# Patient Record
Sex: Male | Born: 1995
Health system: Southern US, Community
[De-identification: ages and names within clinical notes are randomized; demographics above are authoritative.]

## PROBLEM LIST (undated history)

## (undated) DIAGNOSIS — Z8619 Personal history of other infectious and parasitic diseases: Secondary | ICD-10-CM

## (undated) HISTORY — PX: OTHER SURGICAL HISTORY: SHX169

## (undated) HISTORY — DX: Personal history of other infectious and parasitic diseases: Z86.19

---
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
## 2018-03-11 ENCOUNTER — Ambulatory Visit (INDEPENDENT_AMBULATORY_CARE_PROVIDER_SITE_OTHER): Payer: BLUE CROSS/BLUE SHIELD | Admitting: Family Medicine

## 2018-03-11 ENCOUNTER — Encounter: Payer: Self-pay | Admitting: Family Medicine

## 2018-03-11 VITALS — BP 118/70 | HR 70 | Temp 98.7°F | Wt 175.0 lb

## 2018-03-11 DIAGNOSIS — J029 Acute pharyngitis, unspecified: Secondary | ICD-10-CM

## 2018-03-11 DIAGNOSIS — R509 Fever, unspecified: Secondary | ICD-10-CM

## 2018-03-11 DIAGNOSIS — J101 Influenza due to other identified influenza virus with other respiratory manifestations: Secondary | ICD-10-CM

## 2018-03-11 LAB — POC INFLUENZA A&B (BINAX/QUICKVUE)
Influenza A, POC: NEGATIVE
Influenza B, POC: POSITIVE — AB

## 2018-03-11 LAB — POCT RAPID STREP A (OFFICE): RAPID STREP A SCREEN: NEGATIVE

## 2018-03-11 MED ORDER — OSELTAMIVIR PHOSPHATE 75 MG PO CAPS: 75.0000 mg | ORAL_CAPSULE | Freq: Two times a day (BID) | ORAL | 0 refills | Status: DC

## 2018-03-11 NOTE — Progress Notes (Signed)
BP 118/70 (BP Location: Left Arm, Patient Position: Sitting, Cuff Size: Normal)   Pulse 70   Temp 98.7 F (37.1 C) (Oral)   Wt 175 lb (79.4 kg)   SpO2 97%   BMI 23.73 kg/m    CC: ST, fever Subjective:    Patient ID: Jared Suarez, male    DOB: 1996/05/11, 22 y.o.   MRN: [id number]  HPI: Chistian Kasler is a 22 y.o. male presenting on 03/11/2018 for Fever (Started 3 days ago. Max 102. Also, loss of appetite. Took Nyquill last night and 3 ibuprofen this AM, helpful.  Pt accompanied by dad. ); Nausea; Sore Throat; and Headache   2d h/o fever (Tmax 102), productive cough, nasal congestion, HA behind eyes, scratchy throat, nausea. Chills. Progressive onset of symptoms.   No body aches, dyspnea or wheezing, PNDrainage.   Nephew recently with strep  So far has taken ibuprofen and nyquil.  No h/o asthma, allergic rhinitis. Out of class due to illness recently.   Relevant past medical, surgical, family and social history reviewed and updated as indicated. Interim medical history since our last visit reviewed. Allergies and medications reviewed and updated. Outpatient Medications Prior to Visit  Medication Sig Dispense Refill  . naproxen (NAPROSYN) 500 MG tablet Take 1 tablet (500 mg total) by mouth 2 (two) times daily with a meal. 30 tablet 0   No facility-administered medications prior to visit.      Per HPI unless specifically indicated in ROS section below Review of Systems     Objective:    BP 118/70 (BP Location: Left Arm, Patient Position: Sitting, Cuff Size: Normal)   Pulse 70   Temp 98.7 F (37.1 C) (Oral)   Wt 175 lb (79.4 kg)   SpO2 97%   BMI 23.73 kg/m   Wt Readings from Last 3 Encounters:  03/11/18 175 lb (79.4 kg) (77 %, Z= 0.74)*  11/11/16 167 lb (75.8 kg) (74 %, Z= 0.66)*  11/04/16 165 lb (74.8 kg) (72 %, Z= 0.59)*   * Growth percentiles are based on CDC (Boys, 2-20 Years) data.    Physical Exam  Constitutional: He appears well-developed and well-nourished.  No distress.  HENT:  Head: Normocephalic and atraumatic.  Right Ear: Hearing, tympanic membrane, external ear and ear canal normal.  Left Ear: Hearing, tympanic membrane, external ear and ear canal normal.  Nose: Mucosal edema (nasal mucosal congestion) and rhinorrhea present. Right sinus exhibits no maxillary sinus tenderness and no frontal sinus tenderness. Left sinus exhibits no maxillary sinus tenderness and no frontal sinus tenderness.  Mouth/Throat: Uvula is midline, oropharynx is clear and moist and mucous membranes are normal. No oropharyngeal exudate, posterior oropharyngeal edema, posterior oropharyngeal erythema or tonsillar abscesses.  Eyes: Pupils are equal, round, and reactive to light. Conjunctivae and EOM are normal. No scleral icterus.  Neck: Normal range of motion. Neck supple.  Cardiovascular: Normal rate, regular rhythm, normal heart sounds and intact distal pulses.  No murmur heard. Pulmonary/Chest: Effort normal and breath sounds normal. No respiratory distress. He has no wheezes. He has no rales.  Lymphadenopathy:    He has no cervical adenopathy.  Skin: Skin is warm and dry. No rash noted.  Nursing note and vitals reviewed.  Results for orders placed or performed in visit on 03/11/18  POCT rapid strep A  Result Value Ref Range   Rapid Strep A Screen Negative Negative  POC Influenza A&B(BINAX/QUICKVUE)  Result Value Ref Range   Influenza A, POC Negative Negative   Influenza  B, POC Positive (A) Negative      Assessment & Plan:   Problem List Items Addressed This Visit    Influenza B - Primary    URI symptoms with high fever over last 2 days. Nontoxic today. RST negative today. Flu swab positive today.  Discussed supportive care.  Discussed pros/cons of tamiflu - pt would like Rx.  Update if not improving with treatment. Contagious nature of illness reviewed.       Relevant Medications   oseltamivir (TAMIFLU) 75 MG capsule    Other Visit Diagnoses    Sore  throat       Relevant Orders   POCT rapid strep A (Completed)       Meds ordered this encounter  Medications  . oseltamivir (TAMIFLU) 75 MG capsule    Sig: Take 1 capsule (75 mg total) by mouth 2 (two) times daily.    Dispense:  10 capsule    Refill:  0   Orders Placed This Encounter  Procedures  . POCT rapid strep A  . POC Influenza A&B(BINAX/QUICKVUE)    Follow up plan: Return if symptoms worsen or fail to improve.  Eustaquio BoydenJavier Payal Stanforth, MD

## 2018-03-11 NOTE — Assessment & Plan Note (Signed)
URI symptoms with high fever over last 2 days. Nontoxic today. RST negative today. Flu swab positive today.  Discussed supportive care.  Discussed pros/cons of tamiflu - pt would like Rx.  Update if not improving with treatment. Contagious nature of illness reviewed.

## 2018-03-11 NOTE — Patient Instructions (Addendum)
You have influenza B Treat with ibuprofen 600mg  with meals for next several days.  Push fluids and rest.  tamiflu sent to pharmacy.  Influenza, Adult Influenza, more commonly known as "the flu," is a viral infection that primarily affects the respiratory tract. The respiratory tract includes organs that help you breathe, such as the lungs, nose, and throat. The flu causes many common cold symptoms, as well as a high fever and body aches. The flu spreads easily from person to person (is contagious). Getting a flu shot (influenza vaccination) every year is the best way to prevent influenza. What are the causes? Influenza is caused by a virus. You can catch the virus by:  Breathing in droplets from an infected person's cough or sneeze.  Touching something that was recently contaminated with the virus and then touching your mouth, nose, or eyes.  What increases the risk? The following factors may make you more likely to get the flu:  Not cleaning your hands frequently with soap and water or alcohol-based hand sanitizer.  Having close contact with many people during cold and flu season.  Touching your mouth, eyes, or nose without washing or sanitizing your hands first.  Not drinking enough fluids or not eating a healthy diet.  Not getting enough sleep or exercise.  Being under a high amount of stress.  Not getting a yearly (annual) flu shot.  You may be at a higher risk of complications from the flu, such as a severe lung infection (pneumonia), if you:  Are over the age of 20.  Are pregnant.  Have a weakened disease-fighting system (immune system). You may have a weakened immune system if you: ? Have HIV or AIDS. ? Are undergoing chemotherapy. ? Aretaking medicines that reduce the activity of (suppress) the immune system.  Have a long-term (chronic) illness, such as heart disease, kidney disease, diabetes, or lung disease.  Have a liver disorder.  Are obese.  Have  anemia.  What are the signs or symptoms? Symptoms of this condition typically last 4-10 days and may include:  Fever.  Chills.  Headache, body aches, or muscle aches.  Sore throat.  Cough.  Runny or congested nose.  Chest discomfort and cough.  Poor appetite.  Weakness or tiredness (fatigue).  Dizziness.  Nausea or vomiting.  How is this diagnosed? This condition may be diagnosed based on your medical history and a physical exam. Your health care provider may do a nose or throat swab test to confirm the diagnosis. How is this treated? If influenza is detected early, you can be treated with antiviral medicine that can reduce the length of your illness and the severity of your symptoms. This medicine may be given by mouth (orally) or through an IV tube that is inserted in one of your veins. The goal of treatment is to relieve symptoms by taking care of yourself at home. This may include taking over-the-counter medicines, drinking plenty of fluids, and adding humidity to the air in your home. In some cases, influenza goes away on its own. Severe influenza or complications from influenza may be treated in a hospital. Follow these instructions at home:  Take over-the-counter and prescription medicines only as told by your health care provider.  Use a cool mist humidifier to add humidity to the air in your home. This can make breathing easier.  Rest as needed.  Drink enough fluid to keep your urine clear or pale yellow.  Cover your mouth and nose when you cough or sneeze.  Wash your hands with soap and water often, especially after you cough or sneeze. If soap and water are not available, use hand sanitizer.  Stay home from work or school as told by your health care provider. Unless you are visiting your health care provider, try to avoid leaving home until your fever has been gone for 24 hours without the use of medicine.  Keep all follow-up visits as told by your health  care provider. This is important. How is this prevented?  Getting an annual flu shot is the best way to avoid getting the flu. You may get the flu shot in late summer, fall, or winter. Ask your health care provider when you should get your flu shot.  Wash your hands often or use hand sanitizer often.  Avoid contact with people who are sick during cold and flu season.  Eat a healthy diet, drink plenty of fluids, get enough sleep, and exercise regularly. Contact a health care provider if:  You develop new symptoms.  You have: ? Chest pain. ? Diarrhea. ? A fever.  Your cough gets worse.  You produce more mucus.  You feel nauseous or you vomit. Get help right away if:  You develop shortness of breath or difficulty breathing.  Your skin or nails turn a bluish color.  You have severe pain or stiffness in your neck.  You develop a sudden headache or sudden pain in your face or ear.  You cannot stop vomiting. This information is not intended to replace advice given to you by your health care provider. Make sure you discuss any questions you have with your health care provider. Document Released: 12/04/2000 Document Revised: 05/14/2016 Document Reviewed: 10/01/2015 Elsevier Interactive Patient Education  2017 [employer].

## 2018-03-14 ENCOUNTER — Telehealth: Payer: Self-pay

## 2018-03-14 NOTE — Telephone Encounter (Signed)
Pt was seen on 03/11/18 with the flu. Pt needs a note for school to excuse him from 03/10/18 - 03/14/18. Pt request cb when note is ready.

## 2018-03-14 NOTE — Telephone Encounter (Signed)
Copied from CRM [id number]. Topic: General - Other >> Mar 14, 2018  4:36 PM Arlyss Gandyichardson, Taren N, NT wrote: Reason for CRM: Pt needing a note to be out of work for 03/11/18. Please call when note is ready for pickup. Requesting asap.

## 2018-03-14 NOTE — Telephone Encounter (Signed)
Note written and in Jared Suarez's box.

## 2018-03-15 NOTE — Telephone Encounter (Signed)
Attempted to contact pt; no vm available. Letter placed at the front desk for pickup

## 2019-01-13 ENCOUNTER — Emergency Department (HOSPITAL_COMMUNITY): Payer: BLUE CROSS/BLUE SHIELD

## 2019-01-13 ENCOUNTER — Encounter (HOSPITAL_COMMUNITY): Payer: Self-pay

## 2019-01-13 ENCOUNTER — Emergency Department (HOSPITAL_COMMUNITY)
Admission: EM | Admit: 2019-01-13 | Discharge: 2019-01-13 | Disposition: A | Discharge status: Home / Self Care | Payer: BLUE CROSS/BLUE SHIELD | Attending: Emergency Medicine | Admitting: Emergency Medicine

## 2019-01-13 DIAGNOSIS — S62356A Nondisplaced fracture of shaft of fifth metacarpal bone, right hand, initial encounter for closed fracture: Secondary | ICD-10-CM

## 2019-01-13 DIAGNOSIS — Z7722 Contact with and (suspected) exposure to environmental tobacco smoke (acute) (chronic): Secondary | ICD-10-CM | POA: Insufficient documentation

## 2019-01-13 DIAGNOSIS — Y999 Unspecified external cause status: Secondary | ICD-10-CM | POA: Insufficient documentation

## 2019-01-13 DIAGNOSIS — Y939 Activity, unspecified: Secondary | ICD-10-CM | POA: Insufficient documentation

## 2019-01-13 DIAGNOSIS — Y9289 Other specified places as the place of occurrence of the external cause: Secondary | ICD-10-CM | POA: Insufficient documentation

## 2019-01-13 DIAGNOSIS — W2209XA Striking against other stationary object, initial encounter: Secondary | ICD-10-CM | POA: Insufficient documentation

## 2019-01-13 MED ORDER — IBUPROFEN 600 MG PO TABS: 600.0000 mg | ORAL_TABLET | Freq: Four times a day (QID) | ORAL | 0 refills | Status: DC | PRN

## 2019-01-13 NOTE — Discharge Instructions (Signed)
We recommend that you apply ice over top of your splint 3 to 4 times per day to limit swelling and inflammation.  Take ibuprofen as prescribed for pain.  You may supplement this with Tylenol as needed.  Do not remove your splint at any time.  Call the office of Dr. Mina Marble on Monday morning to schedule close follow-up in the office to ensure proper healing of your broken bone.  You may return to the ED as needed for new or concerning symptoms.

## 2019-01-13 NOTE — ED Notes (Signed)
Patient transported to X-ray 

## 2019-01-13 NOTE — ED Notes (Signed)
Patient verbalizes understanding of discharge instructions. Opportunity for questioning and answers were provided. Armband removed by staff, pt discharged from ED.  
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
## 2019-01-24 DIAGNOSIS — M25641 Stiffness of right hand, not elsewhere classified: Secondary | ICD-10-CM | POA: Diagnosis not present

## 2019-01-24 DIAGNOSIS — S62326A Displaced fracture of shaft of fifth metacarpal bone, right hand, initial encounter for closed fracture: Secondary | ICD-10-CM | POA: Diagnosis not present

## 2019-01-24 DIAGNOSIS — M79644 Pain in right finger(s): Secondary | ICD-10-CM | POA: Diagnosis not present

## 2019-01-25 ENCOUNTER — Ambulatory Visit (HOSPITAL_COMMUNITY)
Admission: RE | Admit: 2019-01-25 | Discharge: 2019-01-25 | Disposition: A | Discharge status: Home / Self Care | Payer: BLUE CROSS/BLUE SHIELD | Source: Ambulatory Visit | Attending: Orthopedic Surgery | Admitting: Orthopedic Surgery

## 2019-01-25 ENCOUNTER — Other Ambulatory Visit: Payer: Self-pay | Admitting: Orthopedic Surgery

## 2019-01-25 ENCOUNTER — Inpatient Hospital Stay (HOSPITAL_COMMUNITY): Payer: BLUE CROSS/BLUE SHIELD | Admitting: Anesthesiology

## 2019-01-25 ENCOUNTER — Encounter (HOSPITAL_COMMUNITY)
Admission: RE | Disposition: A | Discharge status: Home / Self Care | Payer: Self-pay | Source: Ambulatory Visit | Attending: Orthopedic Surgery

## 2019-01-25 ENCOUNTER — Encounter (HOSPITAL_COMMUNITY): Payer: Self-pay

## 2019-01-25 ENCOUNTER — Other Ambulatory Visit: Payer: Self-pay

## 2019-01-25 DIAGNOSIS — X58XXXA Exposure to other specified factors, initial encounter: Secondary | ICD-10-CM | POA: Diagnosis not present

## 2019-01-25 DIAGNOSIS — S62326A Displaced fracture of shaft of fifth metacarpal bone, right hand, initial encounter for closed fracture: Secondary | ICD-10-CM | POA: Diagnosis not present

## 2019-01-25 DIAGNOSIS — S62306A Unspecified fracture of fifth metacarpal bone, right hand, initial encounter for closed fracture: Secondary | ICD-10-CM | POA: Diagnosis not present

## 2019-01-25 DIAGNOSIS — Y9366 Activity, soccer: Secondary | ICD-10-CM | POA: Diagnosis not present

## 2019-01-25 HISTORY — PX: ORIF WRIST FRACTURE: SHX2133

## 2019-01-25 SURGERY — OPEN REDUCTION INTERNAL FIXATION (ORIF) WRIST FRACTURE
Anesthesia: General | Site: Hand | Laterality: Right

## 2019-01-25 MED ORDER — CHLORHEXIDINE GLUCONATE 4 % EX LIQD: 60.0000 mL | Freq: Once | CUTANEOUS | Status: DC

## 2019-01-25 MED ORDER — ACETAMINOPHEN 10 MG/ML IV SOLN
INTRAVENOUS | Status: AC
  Filled 2019-01-25: qty 100

## 2019-01-25 MED ORDER — ACETAMINOPHEN 10 MG/ML IV SOLN: 1000.0000 mg | Freq: Once | INTRAVENOUS | Status: DC | PRN

## 2019-01-25 MED ORDER — LIDOCAINE 2% (20 MG/ML) 5 ML SYRINGE
INTRAMUSCULAR | Status: DC | PRN
  Administered 2019-01-25: 100 mg via INTRAVENOUS

## 2019-01-25 MED ORDER — FENTANYL CITRATE (PF) 250 MCG/5ML IJ SOLN
INTRAMUSCULAR | Status: AC
  Filled 2019-01-25: qty 5

## 2019-01-25 MED ORDER — FENTANYL CITRATE (PF) 100 MCG/2ML IJ SOLN: 25.0000 ug | INTRAMUSCULAR | Status: DC | PRN

## 2019-01-25 MED ORDER — DEXAMETHASONE SODIUM PHOSPHATE 10 MG/ML IJ SOLN
INTRAMUSCULAR | Status: DC | PRN
  Administered 2019-01-25: 10 mg via INTRAVENOUS

## 2019-01-25 MED ORDER — PROPOFOL 10 MG/ML IV BOLUS
INTRAVENOUS | Status: AC
  Filled 2019-01-25: qty 20

## 2019-01-25 MED ORDER — OXYCODONE-ACETAMINOPHEN 5-325 MG PO TABS
ORAL_TABLET | ORAL | Status: AC
  Filled 2019-01-25: qty 1

## 2019-01-25 MED ORDER — OXYCODONE-ACETAMINOPHEN 5-325 MG PO TABS: 1.0000 | ORAL_TABLET | ORAL | 0 refills | Status: DC | PRN

## 2019-01-25 MED ORDER — MIDAZOLAM HCL 2 MG/2ML IJ SOLN
INTRAMUSCULAR | Status: DC | PRN
  Administered 2019-01-25: 4 mg via INTRAVENOUS

## 2019-01-25 MED ORDER — BUPIVACAINE HCL (PF) 0.25 % IJ SOLN
INTRAMUSCULAR | Status: AC
  Filled 2019-01-25: qty 30

## 2019-01-25 MED ORDER — PROMETHAZINE HCL 25 MG/ML IJ SOLN: 6.2500 mg | INTRAMUSCULAR | Status: DC | PRN

## 2019-01-25 MED ORDER — DEXMEDETOMIDINE HCL 200 MCG/2ML IV SOLN
INTRAVENOUS | Status: DC | PRN
  Administered 2019-01-25 (×2): 8 ug via INTRAVENOUS
  Administered 2019-01-25: 4 ug via INTRAVENOUS

## 2019-01-25 MED ORDER — DEXAMETHASONE SODIUM PHOSPHATE 10 MG/ML IJ SOLN
INTRAMUSCULAR | Status: AC
  Filled 2019-01-25: qty 1

## 2019-01-25 MED ORDER — OXYCODONE HCL 5 MG/5ML PO SOLN: 5.0000 mg | Freq: Once | ORAL | Status: DC | PRN

## 2019-01-25 MED ORDER — PROPOFOL 10 MG/ML IV BOLUS
INTRAVENOUS | Status: AC
  Filled 2019-01-25: qty 40

## 2019-01-25 MED ORDER — KETOROLAC TROMETHAMINE 30 MG/ML IJ SOLN
INTRAMUSCULAR | Status: DC | PRN
  Administered 2019-01-25: 30 mg via INTRAVENOUS

## 2019-01-25 MED ORDER — ONDANSETRON HCL 4 MG/2ML IJ SOLN
INTRAMUSCULAR | Status: DC | PRN
  Administered 2019-01-25: 4 mg via INTRAVENOUS

## 2019-01-25 MED ORDER — ONDANSETRON HCL 4 MG/2ML IJ SOLN
INTRAMUSCULAR | Status: AC
  Filled 2019-01-25: qty 2

## 2019-01-25 MED ORDER — PROPOFOL 10 MG/ML IV BOLUS
INTRAVENOUS | Status: DC | PRN
  Administered 2019-01-25: 170 mg via INTRAVENOUS

## 2019-01-25 MED ORDER — LACTATED RINGERS IV SOLN
INTRAVENOUS | Status: DC
  Administered 2019-01-25 (×2): via INTRAVENOUS

## 2019-01-25 MED ORDER — 0.9 % SODIUM CHLORIDE (POUR BTL) OPTIME
TOPICAL | Status: DC | PRN
  Administered 2019-01-25: 1000 mL

## 2019-01-25 MED ORDER — OXYCODONE HCL 5 MG PO TABS: 5.0000 mg | ORAL_TABLET | Freq: Once | ORAL | Status: DC | PRN

## 2019-01-25 MED ORDER — FENTANYL CITRATE (PF) 100 MCG/2ML IJ SOLN
INTRAMUSCULAR | Status: DC | PRN
  Administered 2019-01-25: 100 ug via INTRAVENOUS

## 2019-01-25 MED ORDER — MIDAZOLAM HCL 2 MG/2ML IJ SOLN
INTRAMUSCULAR | Status: AC
  Filled 2019-01-25: qty 4

## 2019-01-25 MED ORDER — LIDOCAINE 2% (20 MG/ML) 5 ML SYRINGE
INTRAMUSCULAR | Status: AC
  Filled 2019-01-25: qty 20

## 2019-01-25 MED ORDER — OXYCODONE-ACETAMINOPHEN 5-325 MG PO TABS
1.0000 | ORAL_TABLET | ORAL | Status: DC | PRN
  Administered 2019-01-25: 1 via ORAL

## 2019-01-25 MED ORDER — VANCOMYCIN HCL IN DEXTROSE 1-5 GM/200ML-% IV SOLN
1000.0000 mg | INTRAVENOUS | Status: DC
  Filled 2019-01-25: qty 200

## 2019-01-25 MED ORDER — ACETAMINOPHEN 10 MG/ML IV SOLN
INTRAVENOUS | Status: DC | PRN
  Administered 2019-01-25: 1000 mg via INTRAVENOUS

## 2019-01-25 SURGICAL SUPPLY — 58 items
APL SKNCLS STERI-STRIP NONHPOA (GAUZE/BANDAGES/DRESSINGS) ×1
BANDAGE ACE 3X5.8 VEL STRL LF (GAUZE/BANDAGES/DRESSINGS) ×2 IMPLANT
BANDAGE ACE 4X5 VEL STRL LF (GAUZE/BANDAGES/DRESSINGS) ×2 IMPLANT
BENZOIN TINCTURE PRP APPL 2/3 (GAUZE/BANDAGES/DRESSINGS) ×2 IMPLANT
BNDG CMPR 9X4 STRL LF SNTH (GAUZE/BANDAGES/DRESSINGS) ×1
BNDG ESMARK 4X9 LF (GAUZE/BANDAGES/DRESSINGS) ×2 IMPLANT
BNDG GAUZE ELAST 4 BULKY (GAUZE/BANDAGES/DRESSINGS) ×2 IMPLANT
CORDS BIPOLAR (ELECTRODE) ×2 IMPLANT
COVER SURGICAL LIGHT HANDLE (MISCELLANEOUS) ×2 IMPLANT
COVER WAND RF STERILE (DRAPES) IMPLANT
CUFF TOURNIQUET SINGLE 18IN (TOURNIQUET CUFF) IMPLANT
CUFF TOURNIQUET SINGLE 24IN (TOURNIQUET CUFF) ×2 IMPLANT
DRAPE OEC MINIVIEW 54X84 (DRAPES) ×2 IMPLANT
DRAPE SURG 17X23 STRL (DRAPES) ×2 IMPLANT
DRSG XEROFORM 1X8 (GAUZE/BANDAGES/DRESSINGS) ×2 IMPLANT
DURAPREP 26ML APPLICATOR (WOUND CARE) ×2 IMPLANT
ELECT REM PT RETURN 9FT ADLT (ELECTROSURGICAL)
ELECTRODE REM PT RTRN 9FT ADLT (ELECTROSURGICAL) IMPLANT
GAUZE SPONGE 4X4 12PLY STRL (GAUZE/BANDAGES/DRESSINGS) ×2 IMPLANT
GAUZE XEROFORM 1X8 LF (GAUZE/BANDAGES/DRESSINGS) ×2 IMPLANT
GLOVE SURG SYN 8.0 (GLOVE) ×2 IMPLANT
GOWN STRL REUS W/ TWL LRG LVL3 (GOWN DISPOSABLE) ×1 IMPLANT
GOWN STRL REUS W/ TWL XL LVL3 (GOWN DISPOSABLE) ×1 IMPLANT
GOWN STRL REUS W/TWL LRG LVL3 (GOWN DISPOSABLE) ×2
GOWN STRL REUS W/TWL XL LVL3 (GOWN DISPOSABLE) ×2
KIT BASIN OR (CUSTOM PROCEDURE TRAY) ×2 IMPLANT
KIT TURNOVER KIT B (KITS) ×2 IMPLANT
MANIFOLD NEPTUNE II (INSTRUMENTS) ×2 IMPLANT
NEEDLE HYPO 25GX1X1/2 BEV (NEEDLE) ×2 IMPLANT
NEEDLE HYPO 25X1 1.5 SAFETY (NEEDLE) IMPLANT
NS IRRIG 1000ML POUR BTL (IV SOLUTION) ×2 IMPLANT
PACK ORTHO EXTREMITY (CUSTOM PROCEDURE TRAY) ×2 IMPLANT
PAD ARMBOARD 7.5X6 YLW CONV (MISCELLANEOUS) ×4 IMPLANT
PAD CAST 3X4 CTTN HI CHSV (CAST SUPPLIES) ×1 IMPLANT
PAD CAST 4YDX4 CTTN HI CHSV (CAST SUPPLIES) ×1 IMPLANT
PADDING CAST COTTON 3X4 STRL (CAST SUPPLIES) ×2
PADDING CAST COTTON 4X4 STRL (CAST SUPPLIES) ×2
PENCIL BUTTON HOLSTER BLD 10FT (ELECTRODE) IMPLANT
PLATE STR 6H 29MM (Plate) ×2 IMPLANT
SCREW 1.5X10MM (Screw) ×2 IMPLANT
SCREW 1.5X11MM (Screw) ×2 IMPLANT
SCREW 1.5X8MM (Screw) ×6 IMPLANT
SCREW 1.5X9MM (Screw) ×2 IMPLANT
SCREW BN 9X1.5X3XST CRFRM (Screw) ×1 IMPLANT
SPLINT PLASTER EXTRA FAST 3X15 (CAST SUPPLIES) ×1
SPLINT PLASTER GYPS XFAST 3X15 (CAST SUPPLIES) ×1 IMPLANT
SPONGE LAP 4X18 RFD (DISPOSABLE) IMPLANT
STRIP CLOSURE SKIN 1/2X4 (GAUZE/BANDAGES/DRESSINGS) ×2 IMPLANT
SUT PROLENE 3 0 PS 2 (SUTURE) IMPLANT
SUT VIC AB 3-0 FS2 27 (SUTURE) IMPLANT
SUT VIC AB 4-0 P-3 18X BRD (SUTURE) ×1 IMPLANT
SUT VIC AB 4-0 P3 18 (SUTURE) ×2
SYR CONTROL 10ML LL (SYRINGE) ×2 IMPLANT
TOWEL OR 17X24 6PK STRL BLUE (TOWEL DISPOSABLE) ×2 IMPLANT
TOWEL OR 17X26 10 PK STRL BLUE (TOWEL DISPOSABLE) ×2 IMPLANT
TUBE CONNECTING 12X1/4 (SUCTIONS) ×2 IMPLANT
UNDERPAD 30X30 (UNDERPADS AND DIAPERS) ×2 IMPLANT
WATER STERILE IRR 1000ML POUR (IV SOLUTION) ×2 IMPLANT

## 2019-01-25 NOTE — Op Note (Signed)
Please see dictated report [phone]
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
## 2019-01-25 NOTE — Anesthesia Procedure Notes (Signed)
Procedure Name: LMA Insertion Date/Time: 01/25/2019 3:15 PM Performed by: Laruth Bouchard., CRNA Pre-anesthesia Checklist: Patient identified, Emergency Drugs available, Suction available, Patient being monitored and Timeout performed Patient Re-evaluated:Patient Re-evaluated prior to induction Oxygen Delivery Method: Circle system utilized Preoxygenation: Pre-oxygenation with 100% oxygen Induction Type: IV induction Ventilation: Mask ventilation without difficulty LMA: LMA inserted LMA Size: 5.0 Number of attempts: 2 (LMA #4 placed with significant leak, replaced with LMA #5 with no complications) Placement Confirmation: positive ETCO2 and breath sounds checked- equal and bilateral Tube secured with: Tape Dental Injury: Teeth and Oropharynx as per pre-operative assessment

## 2019-01-25 NOTE — Anesthesia Preprocedure Evaluation (Addendum)
Anesthesia Evaluation  Patient identified by MRN, date of birth, ID band Patient awake    Reviewed: Allergy & Precautions, NPO status , Patient's Chart, lab work & pertinent test results  History of Anesthesia Complications Negative for: history of anesthetic complications  Airway Mallampati: II  TM Distance: >3 FB Neck ROM: Full    Dental  (+) Teeth Intact, Dental Advisory Given   Pulmonary neg pulmonary ROS,    Pulmonary exam normal breath sounds clear to auscultation       Cardiovascular negative cardio ROS Normal cardiovascular exam Rhythm:Regular Rate:Normal     Neuro/Psych negative neurological ROS     GI/Hepatic negative GI ROS, Neg liver ROS,   Endo/Other  negative endocrine ROS  Renal/GU negative Renal ROS     Musculoskeletal negative musculoskeletal ROS (+)   Abdominal   Peds  Hematology negative hematology ROS (+)   Anesthesia Other Findings Day of surgery medications reviewed with the patient.  Reproductive/Obstetrics                            Anesthesia Physical Anesthesia Plan  ASA: I  Anesthesia Plan: General   Post-op Pain Management:    Induction: Intravenous  PONV Risk Score and Plan: 2 and Treatment may vary due to age or medical condition, Ondansetron, Dexamethasone and Midazolam  Airway Management Planned: LMA  Additional Equipment:   Intra-op Plan:   Post-operative Plan: Extubation in OR  Informed Consent: I have reviewed the patients History and Physical, chart, labs and discussed the procedure including the risks, benefits and alternatives for the proposed anesthesia with the patient or authorized representative who has indicated his/her understanding and acceptance.     Dental advisory given  Plan Discussed with: CRNA  Anesthesia Plan Comments:        Anesthesia Quick Evaluation

## 2019-01-25 NOTE — Transfer of Care (Signed)
Immediate Anesthesia Transfer of Care Note  Patient: Jared Suarez  Procedure(s) Performed: OPEN REDUCTION INTERNAL FIXATION (ORIF) Right small metacarpal fracture (Right Hand)  Patient Location: PACU  Anesthesia Type:General  Level of Consciousness: drowsy  Airway & Oxygen Therapy: Patient Spontanous Breathing and Patient connected to nasal cannula oxygen  Post-op Assessment: Report given to RN and Post -op Vital signs reviewed and stable  Post vital signs: Reviewed and stable  Last Vitals:  Vitals Value Taken Time  BP    Temp    Pulse 55 01/25/2019  4:26 PM  Resp 12 01/25/2019  4:27 PM  SpO2 94 % 01/25/2019  4:26 PM  Vitals shown include unvalidated device data.  Last Pain:  Vitals:   01/25/19 1246  TempSrc:   PainSc: 0-No pain         Complications: No apparent anesthesia complications

## 2019-01-26 NOTE — Op Note (Signed)
Jared Suarez, Jared Suarez MEDICAL RECORD [id number] ACCOUNT [id number] DATE OF BIRTH:03-28-1996 FACILITY: [facility] LOCATION: [facility] PHYSICIAN:Aella Ronda A. Mina Marble, MD  OPERATIVE REPORT  DATE OF PROCEDURE:  01/25/2019  PREOPERATIVE DIAGNOSIS:  Displaced right small metacarpal fracture.  POSTOPERATIVE DIAGNOSIS:  Displaced right small metacarpal fracture.  PROCEDURE:  Open reduction internal fixation right small metacarpal fracture using 1.5 mm 6-hole stainless steel compression plate.  SURGEON:  Dairl Ponder, MD  ASSISTANT:  None.  ANESTHESIA:  General.  COMPLICATIONS:  No complications.  DRAINS:  No drains.  DESCRIPTION OF PROCEDURE:  The patient was taken to the operating suite after induction of adequate general anesthetic.  Right upper extremity was prepped and draped in the usual sterile fashion.  An Esmarch was used to exsanguinate the limb and the  tourniquet was inflated to 250 mmHg.  At this point in time, incision was made over the subcutaneous border of the small metacarpal in the right hand.  Dissection was carried down to the extensor mechanism.  The interval between the [facility] to the 5th and  EDQ was split and these tendons was retracted.  A subperiosteal dissection of the fracture site was undertaken.  The fracture site was debrided of clot.  Reduction was performed with reduction clamps.  We then placed a 6-hole 1.5 mm stainless steel plate  with 3 holes proximal and 3 holes distal to the fracture site.  This was done under direct and fluoroscopic imaging.  At the end of the procedure, fluoroscopic imaging in multiple views showed reduction of the fracture and good placement of the  hardware.  Wound was irrigated and loosely closed with 4-0 Vicryl to cover the plate and a 3-0 Prolene, subcuticular stitch on the skin.  Steri-Strips, 4 x 4 fluffs and an ulnar gutter splint was applied.    The patient tolerated this procedure well and went to recovery in stable  fashion.  AN/NUANCE  D:01/25/2019 T:01/26/2019 JOB:[id number]

## 2019-01-26 NOTE — Anesthesia Postprocedure Evaluation (Signed)
Anesthesia Post Note  Patient: Jared Suarez  Procedure(s) Performed: OPEN REDUCTION INTERNAL FIXATION (ORIF) Right small metacarpal fracture (Right Hand)     Patient location during evaluation: PACU Anesthesia Type: General Level of consciousness: awake and alert Pain management: pain level controlled Vital Signs Assessment: post-procedure vital signs reviewed and stable Respiratory status: spontaneous breathing, nonlabored ventilation and respiratory function stable Cardiovascular status: blood pressure returned to baseline and stable Postop Assessment: no apparent nausea or vomiting Anesthetic complications: no    Last Vitals:  Vitals:   01/25/19 1715 01/25/19 1730  BP:  107/71  Pulse: 65 60  Resp: 16 16  Temp: 36.5 C   SpO2: 97% 100%    Last Pain:  Vitals:   01/25/19 1730  TempSrc:   PainSc: 5    Pain Goal:                   Kaylyn Layer

## 2019-01-27 ENCOUNTER — Encounter (HOSPITAL_COMMUNITY): Payer: Self-pay | Admitting: Orthopedic Surgery

## 2019-01-30 DIAGNOSIS — S62326A Displaced fracture of shaft of fifth metacarpal bone, right hand, initial encounter for closed fracture: Secondary | ICD-10-CM | POA: Diagnosis not present

## 2019-02-27 DIAGNOSIS — S62326A Displaced fracture of shaft of fifth metacarpal bone, right hand, initial encounter for closed fracture: Secondary | ICD-10-CM | POA: Diagnosis not present

## 2019-05-18 ENCOUNTER — Ambulatory Visit (INDEPENDENT_AMBULATORY_CARE_PROVIDER_SITE_OTHER): Payer: BLUE CROSS/BLUE SHIELD | Admitting: Family Medicine

## 2019-05-18 ENCOUNTER — Encounter: Payer: Self-pay | Admitting: Family Medicine

## 2019-05-18 VITALS — BP 108/73 | HR 70 | Temp 98.0°F | Ht 73.0 in | Wt 176.2 lb

## 2019-05-18 DIAGNOSIS — R51 Headache: Secondary | ICD-10-CM

## 2019-05-18 DIAGNOSIS — R519 Headache, unspecified: Secondary | ICD-10-CM

## 2019-05-18 NOTE — Assessment & Plan Note (Signed)
Describes migraine type headache that lasted about 8-10 hours with persistent malaise a few hours afterwards. Isolated episode, hasn't had recurrence in the last 2 weeks. Advised monitor for now, let me know if recurrent and to try to identify trigger if recurrent. Pt agrees with plan.  This does not sound consistent with Covid19. Will provide letter for him to return to work.

## 2019-05-18 NOTE — Progress Notes (Signed)
Virtual visit completed through [employer]. Due to national recommendations of social distancing due to COVID-19, a virtual visit is felt to be most appropriate for this patient at this time.   Patient location: home Provider location[facility] If any vitals were documented, they were collected by patient at home unless specified below.    BP 108/73 (BP Location: Right Arm, Patient Position: Sitting)   Pulse 70   Temp 98 F (36.7 C) (Oral)   Ht 6\' 1"  (1.854 m)   Wt 176 lb 3 oz (79.9 kg)   BMI 23.25 kg/m    CC: HA Subjective:    Patient ID: Jared Suarez, male    DOB: 01/23/1996, 23 y.o.   MRN: [id number]  HPI: Jared Suarez is a 23 y.o. male presenting on 05/18/2019 for Headache (Had HA on 05/02/19 that kept pt out of work. Denies any fever, cough or SOB, however, had to stay out of work for 14 days. Due to new COVID 19 policy at job, pt needs doctor's note stating he is fiine to return to work. )   2 wks ago had bad headache that woke him up at night 2am. Treated with ibuprofen without much relief. Called in at work - was taken out of work for 2 wks for Haematologistself quarantine. Felt fine the next day. 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   Describes headache as "tension headache" behind R eye - sharp pain. Nauseated with it, no vomiting. Unsure about photo/phonophobia. Was activity limiting. HA lasted about 8-9 hours, but had malaise for 1-2 hours after this. No alcohol involved, no new foods or supplements or identified triggers. No prior h/o headaches, no personal or fmhx migraines he knows of.   No known exposure to Covid19.  No fevers, chills, cough, dyspnea, ST, body aches, muscle pains, or fatigue. No loss of taste or smell.      Relevant past medical, surgical, family and social history reviewed and updated as indicated. Interim medical history since our last visit reviewed. Allergies and medications reviewed and updated. Outpatient Medications  Prior to Visit  Medication Sig Dispense Refill  . ibuprofen (ADVIL,MOTRIN) 600 MG tablet Take 1 tablet (600 mg total) by mouth every 6 (six) hours as needed. 30 tablet 0  . oseltamivir (TAMIFLU) 75 MG capsule Take 1 capsule (75 mg total) by mouth 2 (two) times daily. 10 capsule 0  . oxyCODONE-acetaminophen (PERCOCET) 5-325 MG tablet Take 1 tablet by mouth every 4 (four) hours as needed for severe pain. 20 tablet 0   No facility-administered medications prior to visit.      Per HPI unless specifically indicated in ROS section below Review of Systems Objective:    BP 108/73 (BP Location: Right Arm, Patient Position: Sitting)   Pulse 70   Temp 98 F (36.7 C) (Oral)   Ht 6\' 1"  (1.854 m)   Wt 176 lb 3 oz (79.9 kg)   BMI 23.25 kg/m   Wt Readings from Last 3 Encounters:  05/18/19 176 lb 3 oz (79.9 kg)  01/25/19 182 lb (82.6 kg)  03/11/18 175 lb (79.4 kg) (77 %, Z= 0.74)*   * Growth percentiles are based on CDC (Boys, 2-20 Years) data.     Physical exam: Gen: alert, NAD, not ill appearing Pulm: speaks in complete sentences without increased work of breathing Psych: normal mood, normal thought content      Assessment & Plan:   Problem List Items Addressed This Visit  Headache - Primary    Describes migraine type headache that lasted about 8-10 hours with persistent malaise a few hours afterwards. Isolated episode, hasn't had recurrence in the last 2 weeks. Advised monitor for now, let me know if recurrent and to try to identify trigger if recurrent. Pt agrees with plan.  This does not sound consistent with Covid19. Will provide letter for him to return to work.           No orders of the defined types were placed in this encounter.  No orders of the defined types were placed in this encounter.   Follow up plan: No follow-ups on file.  Eustaquio Boyden, MD

## 2019-07-26 ENCOUNTER — Encounter: Payer: Self-pay | Admitting: Internal Medicine

## 2019-07-26 ENCOUNTER — Ambulatory Visit (INDEPENDENT_AMBULATORY_CARE_PROVIDER_SITE_OTHER)
Admission: RE | Admit: 2019-07-26 | Discharge: 2019-07-26 | Disposition: A | Discharge status: Home / Self Care | Payer: BC Managed Care – PPO | Source: Ambulatory Visit | Attending: Internal Medicine | Admitting: Internal Medicine

## 2019-07-26 ENCOUNTER — Other Ambulatory Visit: Payer: Self-pay

## 2019-07-26 ENCOUNTER — Ambulatory Visit (INDEPENDENT_AMBULATORY_CARE_PROVIDER_SITE_OTHER): Payer: BC Managed Care – PPO | Admitting: Internal Medicine

## 2019-07-26 VITALS — BP 120/74 | HR 63 | Temp 98.3°F | Wt 180.0 lb

## 2019-07-26 DIAGNOSIS — S8992XA Unspecified injury of left lower leg, initial encounter: Secondary | ICD-10-CM | POA: Diagnosis not present

## 2019-07-26 DIAGNOSIS — M25562 Pain in left knee: Secondary | ICD-10-CM | POA: Diagnosis not present

## 2019-07-26 NOTE — Patient Instructions (Signed)
RICE Therapy for Routine Care of Injuries Many injuries can be cared for with rest, ice, compression, and elevation (RICE therapy). This includes:  Resting the injured part.  Putting ice on the injury.  Putting pressure (compression) on the injury.  Raising the injured part (elevation). Using RICE therapy can help to lessen pain and swelling. Supplies needed:  Ice.  Plastic bag.  Towel.  Elastic bandage.  Pillow or pillows to raise (elevate) your injured body part. How to care for your injury with RICE therapy Rest Limit your normal activities, and try not to use the injured part of your body. You can go back to your normal activities when your doctor says it is okay to do them and you feel okay. Ask your doctor if you should do exercises to help your injury get better. Ice Put ice on the injured area. Do not put ice on your bare skin.  Put ice in a plastic bag.  Place a towel between your skin and the bag.  Leave the ice on for 20 minutes, 2-3 times a day. Use ice on as many days as told by your doctor.  Compression Compression means putting pressure on the injured area. This can be done with an elastic bandage. If an elastic bandage has been put on your injury:  Do not wrap the bandage too tight. Wrap the bandage more loosely if part of your body away from the bandage is blue, swollen, cold, painful, or loses feeling (gets numb).  Take off the bandage and put it on again. Do this every 3-4 hours or as told by your doctor.  See your doctor if the bandage seems to make your problems worse.  Elevation Elevation means keeping the injured area raised. If you can, raise the injured area above your heart or the center of your chest. Contact a doctor if:  You keep having pain and swelling.  Your symptoms get worse. Get help right away if:  You have sudden bad pain at your injury or lower than your injury.  You have redness or more swelling around your injury.  You  have tingling or numbness at your injury or lower than your injury, and it does not go away when you take off the bandage. Summary  Many injuries can be cared for using rest, ice, compression, and elevation (RICE therapy).  You can go back to your normal activities when you feel okay and your doctor says it is okay.  Put ice on the injured area as told by your doctor.  Get help if your symptoms get worse or if you keep having pain and swelling. This information is not intended to replace advice given to you by your health care provider. Make sure you discuss any questions you have with your health care provider. Document Released: 05/25/2008 Document Revised: 08/27/2017 Document Reviewed: 08/27/2017 Elsevier Patient Education  2020 Elsevier Inc.  

## 2019-07-26 NOTE — Progress Notes (Signed)
Subjective:    Patient ID: Jared Suarez, male    DOB: 05/14/1996, 23 y.o.   MRN: [id number]  HPI  Pt presents to the clinic today with c/o left knee pain. He reports this started last night after playing soccer. He denies any injury to the knee but reports he collided with another player. He describes the pain as piercing. The pain does not radiate. He denies tingling, weakness, but has some swelling of the outside of the knee. The pain worsened this morning. He denies numbness, tingling or weakness. He has never had any surgery on the left knee.  He has not tried ice or any medications OTC for this.  Review of Systems      Past Medical History:  Diagnosis Date  . History of chicken pox     No current outpatient medications on file.   No current facility-administered medications for this visit.     Allergies  Allergen Reactions  . Rocephin [Ceftriaxone Sodium In Dextrose] Other (See Comments)    Unknown    Family History  Problem Relation Age of Onset  . Cancer Mother        NHL  . Sudden death Paternal Uncle [id number]       massive MI  . Diabetes Maternal Grandfather   . Cancer Other        breast, lung, MM (great grandparents)  . Stroke Paternal Grandfather   . Hypertension Father   . CAD Paternal Uncle [id number]       MI?  Marland Kitchen. Dementia Paternal Grandmother        early onset, ?Alz (50s)  . COPD Paternal Grandmother     Social History   Socioeconomic History  . Marital status: Single    Spouse name: Not on file  . Number of children: Not on file  . Years of education: Not on file  . Highest education level: Not on file  Occupational History  . Not on file  Social Needs  . Financial resource strain: Not on file  . Food insecurity    Worry: Not on file    Inability: Not on file  . Transportation needs    Medical: Not on file    Non-medical: Not on file  Tobacco Use  . Smoking status: Passive Smoke Exposure - Never Smoker  . Smokeless tobacco: Never Used  Substance  and Sexual Activity  . Alcohol use: No  . Drug use: No  . Sexual activity: Not on file  Lifestyle  . Physical activity    Days per week: Not on file    Minutes per session: Not on file  . Stress: Not on file  Relationships  . Social Musicianconnections    Talks on phone: Not on file    Gets together: Not on file    Attends religious service: Not on file    Active member of club or organization: Not on file    Attends meetings of clubs or organizations: Not on file    Relationship status: Not on file  . Intimate partner violence    Fear of current or ex partner: Not on file    Emotionally abused: Not on file    Physically abused: Not on file    Forced sexual activity: Not on file  Other Topics Concern  . Not on file  Social History Narrative   Lives with parents, 4 siblings (12yo to 23yo), and nephew, 2 dogs.    [place]  [employer] 11th, A/B/C. Cs in math and chemistry.    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 Wants to go on to college.     Constitutional: Denies fever, malaise, fatigue, headache or abrupt weight changes.  Musculoskeletal: Pt reports left knee pain. Denies decrease in range of motion, difficulty with gait, muscle pain or joint  swelling.  Skin: Denies abrasion, bruising LLE.  Neurological: Denies numbness, tingling or weakness of LLE, problems with balance and coordination.    No other specific complaints in a complete review of systems (except as listed in HPI above).  Objective:   Physical Exam   BP 120/74   Pulse 63   Temp 98.3 F (36.8 C) (Temporal)   Wt 180 lb (81.6 kg)   SpO2 98%   BMI 23.75 kg/m  Wt Readings from Last 3 Encounters:  07/26/19 180 lb (81.6 kg)  05/18/19 176 lb 3 oz (79.9 kg)  01/25/19 182 lb (82.6 kg)    General: Appears his stated age, well developed, well nourished in NAD. Skin: Warm, dry and intact. No abrasions noted. Musculoskeletal: Normal flexion and extension of the left knee. Pain with palpation along  the lateral joint line. No signs of joint swelling. Negative McMurray, Negative Lachman's. No difficulty with gait.  Neurological: Alert and oriented.      Assessment & Plan:   Acute Left Knee Pain:  Xray left knee today Discussed rest and RICE therapy Knee exercises given  Will follow up after xray, return precautions discussed Webb Silversmith, NP

## 2019-07-27 ENCOUNTER — Telehealth: Payer: Self-pay | Admitting: Family Medicine

## 2019-07-27 NOTE — Telephone Encounter (Signed)
Pt called to get results from 8/5 x-ray. Request a callback

## 2019-11-28 DIAGNOSIS — L720 Epidermal cyst: Secondary | ICD-10-CM | POA: Diagnosis not present

## 2019-11-28 DIAGNOSIS — L700 Acne vulgaris: Secondary | ICD-10-CM | POA: Diagnosis not present

## 2020-01-09 DIAGNOSIS — L700 Acne vulgaris: Secondary | ICD-10-CM | POA: Diagnosis not present

## 2020-08-06 ENCOUNTER — Encounter: Payer: Self-pay | Admitting: Family Medicine

## 2020-08-06 ENCOUNTER — Ambulatory Visit (INDEPENDENT_AMBULATORY_CARE_PROVIDER_SITE_OTHER): Payer: BC Managed Care – PPO | Admitting: Family Medicine

## 2020-08-06 ENCOUNTER — Other Ambulatory Visit: Payer: Self-pay

## 2020-08-06 VITALS — BP 120/78 | HR 52 | Temp 98.1°F | Ht 73.0 in | Wt 185.2 lb

## 2020-08-06 DIAGNOSIS — S8992XA Unspecified injury of left lower leg, initial encounter: Secondary | ICD-10-CM | POA: Diagnosis not present

## 2020-08-06 DIAGNOSIS — G8929 Other chronic pain: Secondary | ICD-10-CM | POA: Insufficient documentation

## 2020-08-06 NOTE — Patient Instructions (Addendum)
Take Ibuprofen 600-800 mg every 8-12 hours   Ice  Rest (no runny or soccer) Return to see Dr. Patsy Lager next week if still having stability issues and pain  Call back if stability or pain is worsening

## 2020-08-06 NOTE — Assessment & Plan Note (Signed)
Suspect possible ligament vs meniscus vs severe muscle strain/tear. He endorses instability but due to guarding exam limited. Discussed MRI now vs NSAIDs and return for re-evaluation with Dr. Patsy Lager for sports medicine in 1 week if symptoms still present. He will watch and wait and return next week.

## 2020-08-06 NOTE — Progress Notes (Signed)
Subjective:     Jared Suarez is a 24 y.o. male presenting for Knee Pain (L x 3 months, pain increased in last 2 days )     HPI   #Left knee pain - 07/26/2019 thought maybe tore his meniscus - told to rest for a few weeks - started feeling better - a few months ago (3-4 months) was playing indoor soccer on thin turf - started having some pain after that game - Sunday was tackling someone and collided  - thinks he may have felt a pop - was able to walk but when standing felt unstable - 08/04/2020 - did have posterior swelling - continued to play after the initial tackle (3 games that day) - during the 3rd game the pain was so bad - difficulty bending the knee  - now with some locking - significant pain in the knee - has not iced the knee - has not taking medication - swelling has improved - has been resting - is unable to run   Review of Systems   Social History   Tobacco Use  Smoking Status Passive Smoke Exposure - Never Smoker  Smokeless Tobacco Never Used        Objective:    BP Readings from Last 3 Encounters:  08/06/20 120/78  07/26/19 120/74  05/18/19 108/73   Wt Readings from Last 3 Encounters:  08/06/20 185 lb 4 oz (84 kg)  07/26/19 180 lb (81.6 kg)  05/18/19 176 lb 3 oz (79.9 kg)    BP 120/78   Pulse (!) 52   Temp 98.1 F (36.7 C) (Temporal)   Ht 6\' 1"  (1.854 m)   Wt 185 lb 4 oz (84 kg)   SpO2 98%   BMI 24.44 kg/m    Physical Exam Constitutional:      Appearance: Normal appearance. He is not ill-appearing or diaphoretic.  HENT:     Right Ear: External ear normal.     Left Ear: External ear normal.     Nose: Nose normal.  Eyes:     General: No scleral icterus.    Extraocular Movements: Extraocular movements intact.     Conjunctiva/sclera: Conjunctivae normal.  Cardiovascular:     Rate and Rhythm: Normal rate.  Pulmonary:     Effort: Pulmonary effort is normal.  Musculoskeletal:     Cervical back: Neck supple.     Comments: Left  knee: Inspection: no swelling or effusion Palpation: TTP along the posterior knee, at the upper calf muscle ROM: normal but with pain with full extension/flexion Strength: normal Ligament testing - patient with significant guarding on exam and unable to fully assess ligaments, no laxity noted.   Skin:    General: Skin is warm and dry.  Neurological:     Mental Status: He is alert. Mental status is at baseline.  Psychiatric:        Mood and Affect: Mood normal.           Assessment & Plan:   Problem List Items Addressed This Visit      Other   Injury of left knee - Primary    Suspect possible ligament vs meniscus vs severe muscle strain/tear. He endorses instability but due to guarding exam limited. Discussed MRI now vs NSAIDs and return for re-evaluation with Dr. for sports medicine in 1 week if symptoms still present. He will watch and wait and return next week.           Return in about 6  days (around 08/12/2020).  Lynnda Child, MD  This visit occurred during the SARS-CoV-2 public health emergency.  Safety protocols were in place, including screening questions prior to the visit, additional usage of staff PPE, and extensive cleaning of exam room while observing appropriate contact time as indicated for disinfecting solutions.

## 2020-08-11 NOTE — Progress Notes (Signed)
Jared Suarez T. Jared Sandler, MD, CAQ Sports Medicine  Primary Care and Sports Medicine [facility] at [facility] [address] [place], [address]  Phone: [phone]   FAX: [phone]  Jared Suarez - 24 y.o. male   MRN [id number]   Date of Birth: 28-Mar-1996  Date: 08/12/2020   PCP: Eustaquio Boyden, MD   Referral: Eustaquio Boyden, MD  Chief Complaint  Patient presents with   Knee Pain    Left    This visit occurred during the SARS-CoV-2 public health emergency.  Safety protocols were in place, including screening questions prior to the visit, additional usage of staff PPE, and extensive cleaning of exam room while observing appropriate contact time as indicated for disinfecting solutions.   Subjective:   Jared Suarez is a 24 y.o. very pleasant male patient with Body mass index is 24.21 kg/m. who presents with the following:  The patient presents in consultation from my partner Dr. Selena Batten for evaluation of left-sided knee pain.  He was playing soccer with several games in a day and at that point he did develop some pain.  He had another recent injury within the last 10 days and subsequently he did have some significant pain and was ultimately unable to play.  He was tackling somebody while playing soccer.  Currently he is having some mechanical symptoms as well as some functional giving way and a sensation of instability.  He initially did have some swelling.  Currently, he is unable to run.  He is here today for further evaluation of left-sided knee pain.  He is not able to play soccer.  Normally.  He is also not been able to lift lower body weights.  He does have pain in the posterior aspect of his knee as well as some posterior medial and posterior lateral pain.  Initially he did have an effusion, but he does not have 1 now.  Runs a lot of intervals, runs a lot with soccer.   HS tendinopathy Some joint line tenderness  Review of Systems is noted in the HPI,  as appropriate   Objective:   BP 100/66    Pulse 68    Temp 97.9 F (36.6 C) (Temporal)    Ht 6\' 1"  (1.854 m)    Wt 183 lb 8 oz (83.2 kg)    SpO2 97%    BMI 24.21 kg/m    GEN: No acute distress; alert,appropriate. PULM: Breathing comfortably in no respiratory distress PSYCH: Normally interactive.    Left knee: Full extension and flexion to 130 degrees.  No effusion.  No pain with loading the patellar facets.  No apprehension.  Stable to varus and valgus stress.  Lachman as well as anterior and posterior drawer testing are negative.  No pain with bounce home testing.  He does have some pain with flexion pinch as well as McMurray's without mechanical symptoms appreciated.  Does have some posterior medial as well as posterior lateral tenderness.  5/5 strength and quad testing, hip abduction, abduction as well as flexion.  Hamstring strength testing on the right is 5/5 in concentric and eccentric testing. On the left, the patient does have tenderness at the distal hamstring and concentrated testing at 90 as well as eccentric training at 30.  Neurovascularly intact.  Radiology: DG Knee Complete 4 Views Left  Result Date: 08/12/2020 CLINICAL DATA:  Left knee pain after soccer injury. EXAM: LEFT KNEE - COMPLETE 4+ VIEW COMPARISON:  July 26, 2019. FINDINGS: No evidence of  fracture, dislocation, or joint effusion. No evidence of arthropathy or other focal bone abnormality. Soft tissues are unremarkable. IMPRESSION: Negative. Electronically Signed   By: Lupita Raider M.D.   On: 08/12/2020 16:28    The radiological images were independently reviewed by myself in the office and results were reviewed with the patient. My independent interpretation of images: There is no evidence for acute bony changes including no evidence of fracture, dislocation and the patient has no degenerative change. Electronically Signed  By: Hannah Beat, MD On: 08/12/2020 11:20 AM EDT   Assessment and Plan:      ICD-10-CM   1. Acute pain of left knee  M25.562 DG Knee Complete 4 Views Left    DG Knee Complete 4 Views Left  2. Hamstring tendinitis of left thigh  M76.892    Certainly he has hamstring tendinopathy.  I am not convinced that this is the entirety of his knee issue as he has had multiple episodes of significant pain and he is not able to run cut or do intervals at all right now.  He does have some provokable knee pain as well with meniscal loading.  I think that it is reasonable to give him a challenge with alteration of workouts for the next few weeks and then I will reassess him in the office.  I reviewed how to do this with him face-to-face.  At the age of 63, low threshold for advanced imaging given cartilage disruption can be repaired in this age range.  I appreciate the opportunity to evaluate this very friendly patient. If you have any question regarding his care or prognosis, do not hesitate to ask.   Patient Instructions  Treadmill: Calf and posterior lower extremity / hamstring rehab  Forward Walking: Go light 2 mins, easy about 2-3 mph: At Sideways Left: 2 mins, 0.6 - 0.8 mph Sideways Right: 2 mins, 0.6 - 0.8 mph Backwards, 2 mins, 1.8 - 2.2 mph Repeat, several cycles Goal is 30 minute  Start at a 3 degree incline - can slightly lower if necessary When able, increase to 3.5, then 4, then 4.5 (After you comfortably can do 30 minutes) You can also slowly increase the speed  I want you to be able to do this comfortably before you do intervals,  But you can do you some light jogging and progress to intervals based on your pain. If you go to rapidly, then you will likely get a setback.     Follow-up: Return in about 3 weeks (around 09/02/2020).  Meds ordered this encounter  Medications   predniSONE (DELTASONE) 20 MG tablet    Sig: 2 tabs po daily for 5 days, then 1 tab po daily for 5 days    Dispense:  15 tablet    Refill:  0   There are no discontinued  medications. Orders Placed This Encounter  Procedures   DG Knee Complete 4 Views Left    Signed,  Domenique Southers T. Jaquasha Carnevale, MD   Outpatient Encounter Medications as of 08/12/2020  Medication Sig   predniSONE (DELTASONE) 20 MG tablet 2 tabs po daily for 5 days, then 1 tab po daily for 5 days   No facility-administered encounter medications on file as of 08/12/2020.

## 2020-08-12 ENCOUNTER — Ambulatory Visit (INDEPENDENT_AMBULATORY_CARE_PROVIDER_SITE_OTHER): Payer: BC Managed Care – PPO | Admitting: Family Medicine

## 2020-08-12 ENCOUNTER — Encounter: Payer: Self-pay | Admitting: Family Medicine

## 2020-08-12 ENCOUNTER — Other Ambulatory Visit: Payer: Self-pay

## 2020-08-12 ENCOUNTER — Ambulatory Visit (INDEPENDENT_AMBULATORY_CARE_PROVIDER_SITE_OTHER)
Admission: RE | Admit: 2020-08-12 | Discharge: 2020-08-12 | Disposition: A | Discharge status: Home / Self Care | Payer: BC Managed Care – PPO | Source: Ambulatory Visit | Attending: Family Medicine | Admitting: Family Medicine

## 2020-08-12 VITALS — BP 100/66 | HR 68 | Temp 97.9°F | Ht 73.0 in | Wt 183.5 lb

## 2020-08-12 DIAGNOSIS — M25562 Pain in left knee: Secondary | ICD-10-CM | POA: Diagnosis not present

## 2020-08-12 DIAGNOSIS — S8992XA Unspecified injury of left lower leg, initial encounter: Secondary | ICD-10-CM | POA: Diagnosis not present

## 2020-08-12 DIAGNOSIS — M25561 Pain in right knee: Secondary | ICD-10-CM

## 2020-08-12 DIAGNOSIS — M76892 Other specified enthesopathies of left lower limb, excluding foot: Secondary | ICD-10-CM | POA: Diagnosis not present

## 2020-08-12 MED ORDER — PREDNISONE 20 MG PO TABS: ORAL_TABLET | ORAL | 0 refills | Status: DC

## 2020-08-12 NOTE — Patient Instructions (Signed)
Treadmill: Calf and posterior lower extremity / hamstring rehab  Forward Walking: Go light 2 mins, easy about 2-3 mph: At Sideways Left: 2 mins, 0.6 - 0.8 mph Sideways Right: 2 mins, 0.6 - 0.8 mph Backwards, 2 mins, 1.8 - 2.2 mph Repeat, several cycles Goal is 30 minute  Start at a 3 degree incline - can slightly lower if necessary When able, increase to 3.5, then 4, then 4.5 (After you comfortably can do 30 minutes) You can also slowly increase the speed  I want you to be able to do this comfortably before you do intervals,  But you can do you some light jogging and progress to intervals based on your pain. If you go to rapidly, then you will likely get a setback.

## 2020-09-04 ENCOUNTER — Ambulatory Visit: Payer: BC Managed Care – PPO | Admitting: Family Medicine

## 2020-09-11 ENCOUNTER — Other Ambulatory Visit: Payer: Self-pay

## 2020-09-11 ENCOUNTER — Ambulatory Visit (INDEPENDENT_AMBULATORY_CARE_PROVIDER_SITE_OTHER): Payer: BC Managed Care – PPO | Admitting: Family Medicine

## 2020-09-11 ENCOUNTER — Encounter: Payer: Self-pay | Admitting: Family Medicine

## 2020-09-11 VITALS — BP 100/80 | HR 57 | Temp 98.5°F | Ht 73.0 in | Wt 185.0 lb

## 2020-09-11 DIAGNOSIS — M25562 Pain in left knee: Secondary | ICD-10-CM | POA: Diagnosis not present

## 2020-09-11 NOTE — Progress Notes (Signed)
    Jared Steeves T. Cleveland Yarbro, MD, CAQ Sports Medicine  Primary Care and Sports Medicine [facility] at [facility] [address] [place], [address]  Phone: [phone]  FAX: [phone]  Jared Suarez - 24 y.o. male  MRN [id number]  Date of Birth: 1996/10/18  Date: 09/11/2020  PCP: Eustaquio Boyden, MD  Referral: Eustaquio Boyden, MD  Chief Complaint  Patient presents with  . Follow-up    Left Knee    This visit occurred during the SARS-CoV-2 public health emergency.  Safety protocols were in place, including screening questions prior to the visit, additional usage of staff PPE, and extensive cleaning of exam room while observing appropriate contact time as indicated for disinfecting solutions.   Subjective:   Jared Suarez is a 25 y.o. very pleasant male patient with Body mass index is 24.41 kg/m. who presents with the following:  He is a pleasant young man who presents in follow-up after an acute knee pain injury.  Playing last Friday.   Ran a mile yesterday.  He is really doing much better.  He played a full soccer game last Friday and he ran a mile yesterday and none of the things caused him any kind of significant pain.  No pain afterwards.  He does have some pain occasionally going down stairs.  Review of Systems is noted in the HPI, as appropriate   Objective:   BP 100/80   Pulse (!) 57   Temp 98.5 F (36.9 C) (Temporal)   Ht 6\' 1"  (1.854 m)   Wt 185 lb (83.9 kg)   SpO2 98%   BMI 24.41 kg/m    GEN: No acute distress; alert,appropriate. PULM: Breathing comfortably in no respiratory distress PSYCH: Normally interactive.    Left knee: Full extension and flexion to 135.  Stable to varus and valgus stress.  No patellar crepitus.  ACL, PCL, and LCL are all stable.  MCL stable.  Negative McMurray's, flexion pinch and bounce home testing.  Radiology: No results found.  Assessment and Plan:     ICD-10-CM   1. Acute pain of left knee   M25.562    Knee pain is almost entirely resolved, progress activity as tolerated.  Follow-up with me almost as needed  Follow-up: No follow-ups on file.  No orders of the defined types were placed in this encounter.  Medications Discontinued During This Encounter  Medication Reason  . predniSONE (DELTASONE) 20 MG tablet Completed Course   No orders of the defined types were placed in this encounter.   Signed,  . Cheng Dec, MD   Outpatient Encounter Medications as of 09/11/2020  Medication Sig  . [DISCONTINUED] predniSONE (DELTASONE) 20 MG tablet 2 tabs po daily for 5 days, then 1 tab po daily for 5 days   No facility-administered encounter medications on file as of 09/11/2020.

## 2021-02-24 IMAGING — DX DG KNEE COMPLETE 4+V*L*
5 series · 5 of 5 positions shown · non-contrast
Comparison: July 26, 2019.

CLINICAL DATA: Left knee pain after soccer injury.

EXAM:
LEFT KNEE - COMPLETE 4+ VIEW

[knee ap]
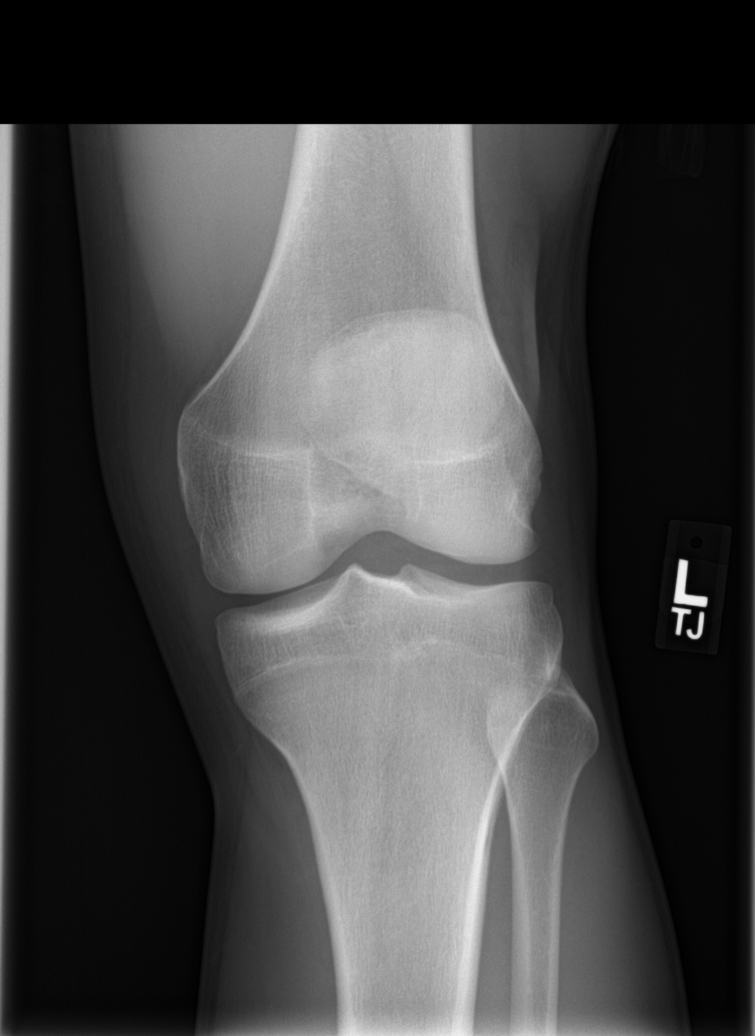

[knee tunnel]
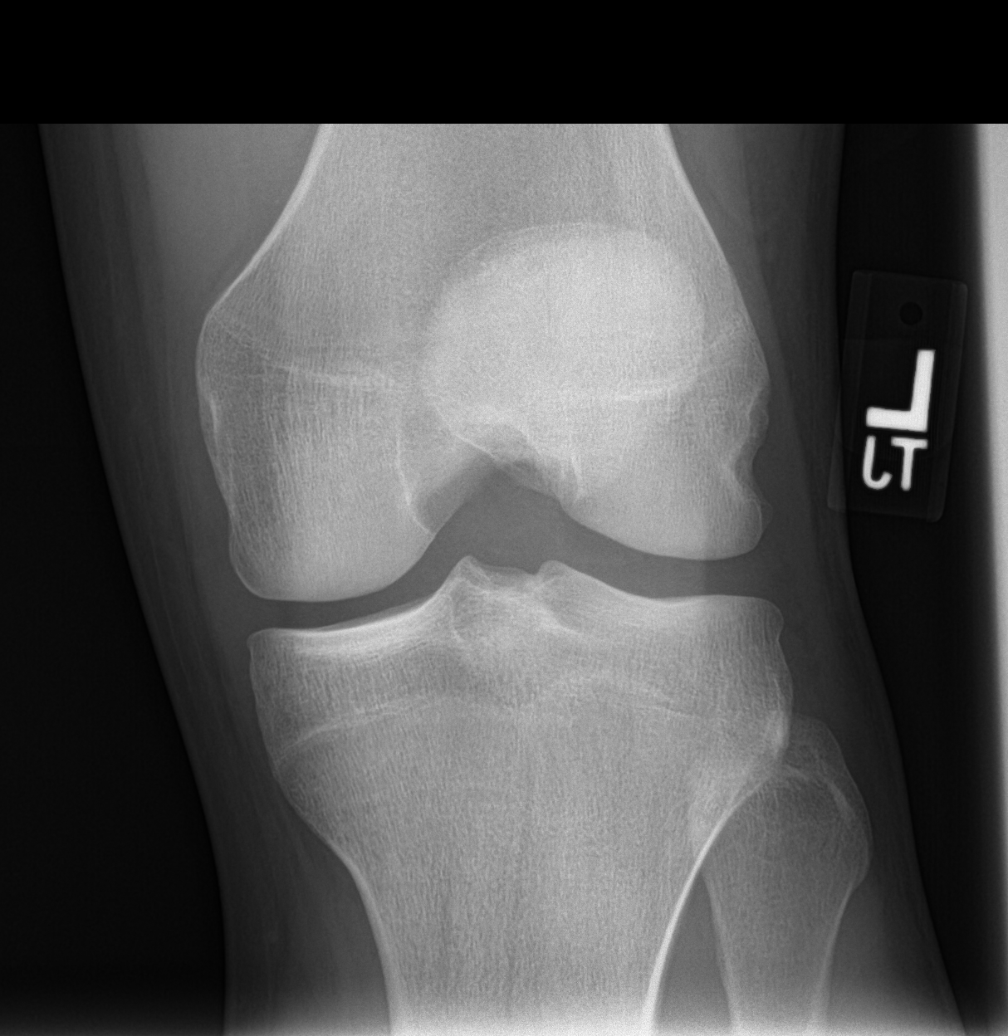

[knee lat]
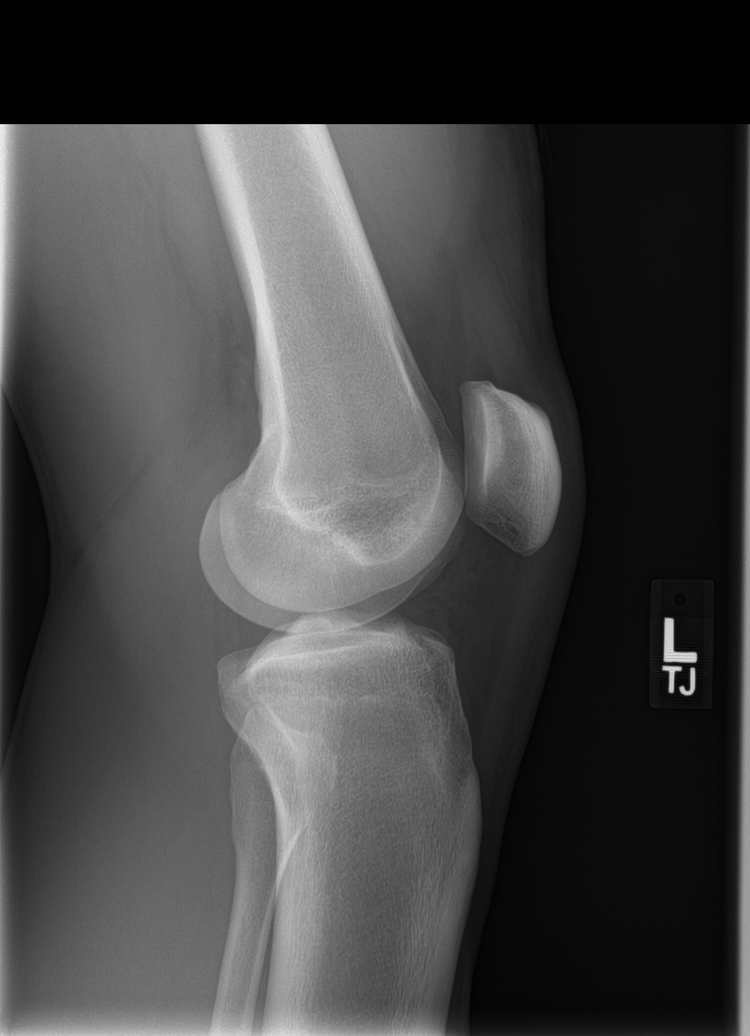

[knee obl (1 of 2)]
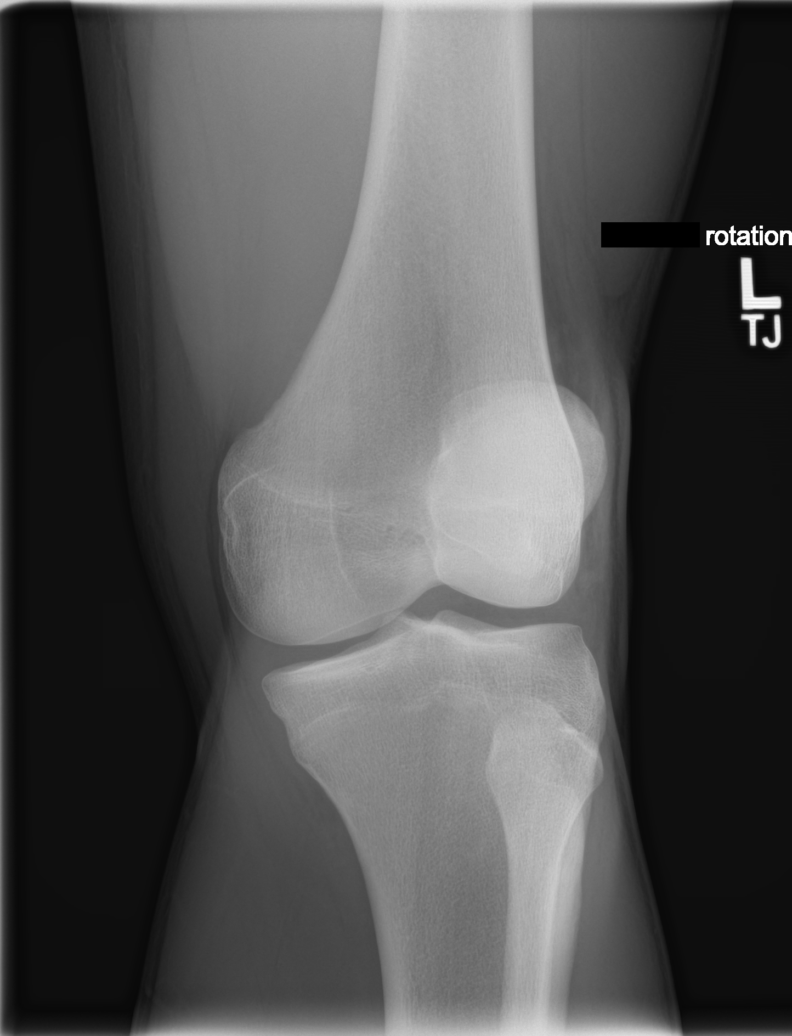

[knee obl (2 of 2)]
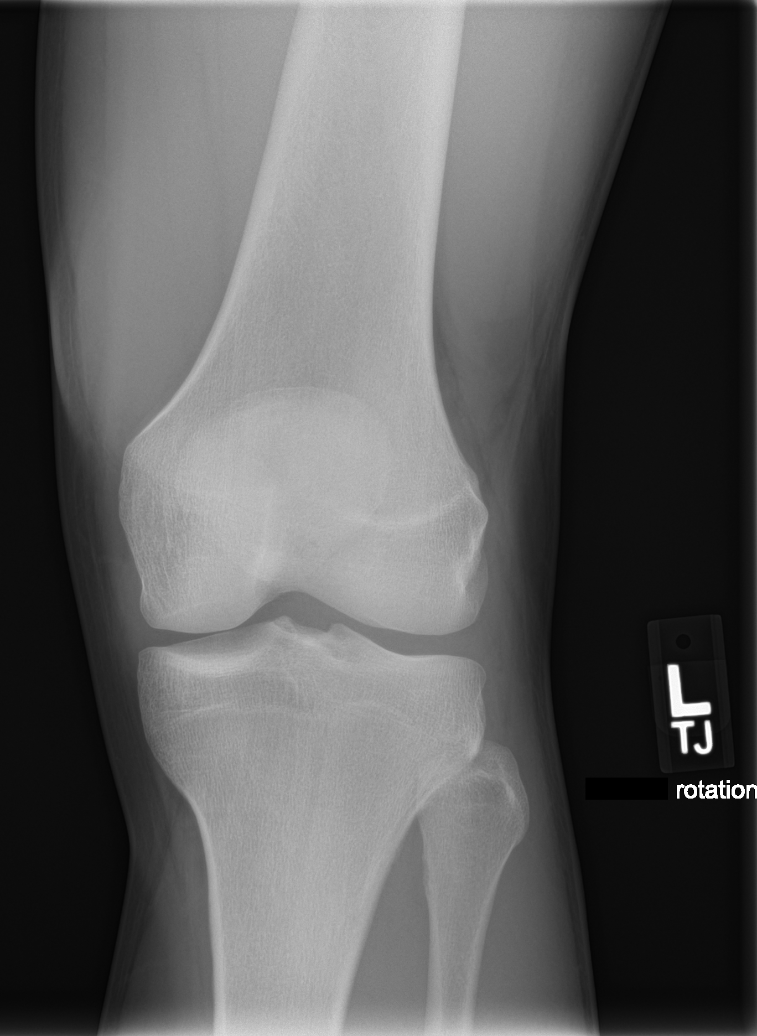

[5 of 5 positions shown; findings below may reference images not displayed]

FINDINGS: No evidence of fracture, dislocation, or joint effusion. No evidence
of arthropathy or other focal bone abnormality. Soft tissues are
unremarkable.
IMPRESSION: Negative.

## 2021-04-23 DIAGNOSIS — L02416 Cutaneous abscess of left lower limb: Secondary | ICD-10-CM | POA: Diagnosis not present

## 2021-04-26 DIAGNOSIS — L02416 Cutaneous abscess of left lower limb: Secondary | ICD-10-CM | POA: Diagnosis not present

## 2021-07-24 DIAGNOSIS — Z03818 Encounter for observation for suspected exposure to other biological agents ruled out: Secondary | ICD-10-CM | POA: Diagnosis not present

## 2021-12-11 ENCOUNTER — Ambulatory Visit: Payer: BC Managed Care – PPO | Admitting: Nurse Practitioner

## 2021-12-19 ENCOUNTER — Encounter: Payer: Self-pay | Admitting: Family Medicine

## 2021-12-19 ENCOUNTER — Ambulatory Visit (INDEPENDENT_AMBULATORY_CARE_PROVIDER_SITE_OTHER): Payer: BC Managed Care – PPO | Admitting: Family Medicine

## 2021-12-19 ENCOUNTER — Other Ambulatory Visit: Payer: Self-pay

## 2021-12-19 VITALS — BP 120/72 | HR 66 | Temp 98.2°F | Ht 73.0 in | Wt 193.1 lb

## 2021-12-19 DIAGNOSIS — R1030 Lower abdominal pain, unspecified: Secondary | ICD-10-CM | POA: Diagnosis not present

## 2021-12-19 DIAGNOSIS — G8929 Other chronic pain: Secondary | ICD-10-CM

## 2021-12-19 DIAGNOSIS — N50812 Left testicular pain: Secondary | ICD-10-CM

## 2021-12-19 DIAGNOSIS — I861 Scrotal varices: Secondary | ICD-10-CM | POA: Diagnosis not present

## 2021-12-19 DIAGNOSIS — M25562 Pain in left knee: Secondary | ICD-10-CM

## 2021-12-19 LAB — POC URINALSYSI DIPSTICK (AUTOMATED)
Bilirubin, UA: NEGATIVE
Blood, UA: NEGATIVE
Glucose, UA: NEGATIVE
Ketones, UA: NEGATIVE
Leukocytes, UA: NEGATIVE
Nitrite, UA: NEGATIVE
Protein, UA: NEGATIVE
Spec Grav, UA: 1.015
Urobilinogen, UA: 0.2 U/dL
pH, UA: 8

## 2021-12-19 NOTE — Patient Instructions (Addendum)
For left knee and bilateral groin pain we will refer you to ortho for further evaluation.  For left testicular pain, stop testosterone supplement. We will refer you to urology for evaluation.

## 2021-12-19 NOTE — Assessment & Plan Note (Addendum)
Tender varicocele present for over a year. Given pain associated with it, will refer to urology for further evaluation/management Advised stop testosterone booster use given insufficient evidence over risks/benefits.

## 2021-12-19 NOTE — Progress Notes (Signed)
Patient ID: Jared Suarez, male    DOB: 1995-12-29, 25 y.o.   MRN: [id number]  This visit was conducted in person.  BP 120/72    Pulse 66    Temp 98.2 F (36.8 C) (Temporal)    Ht 6\' 1"  (1.854 m)    Wt 193 lb 1 oz (87.6 kg)    SpO2 98%    BMI 25.47 kg/m    CC: L knee pain, testicular pain Subjective:   HPI: Jared Suarez is a 25 y.o. male presenting on 12/19/2021 for Knee Pain (C/o left knee pain.  Injured knee about 2 yrs ago. Seen for previously. Feels like knee locks up and has pain after standing long periods of time. ) and Testicle Pain (C/o left testicle and bilateral groin pain. Started about 1 yr ago.  Pain usually occurs when sneezing or working out. )   2+yr h/o L knee pain that started after knee injury sustained during tackle while playing soccer.  Previously saw Dr 12/21/2021 for similar pain 08/2020 - hamstring tendinopathy that improved with supportive care. Xrays at that time were normal.  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 Currently in preseason, new season will start 02/2022. Notes knee pain returns towards end of soccer season.  Knee feels like it locks up and unstable.  Manages this with ibuprofen 1000mg  prior to sports/activities.   1+yr L>R groin pain off and on for the past year associated with some L testicular pain worse with prolonged standing, better when sitting and resting.  Groin pain worse with running and sneezing.  No bulge noted, abd pain, nausea, vomiting, bowel changes, blood in stool or urine, urethral discharge, dysuria.   He's been taking beet root supplement and natural testosterone booster with ashwaganda, 03/2022, and along with other vitamins and minerals (D3, boric acid, magnesium, etc).      Relevant past medical, surgical, family and social history reviewed and updated as indicated. Interim medical history since our last visit reviewed. Allergies and medications reviewed and updated. No outpatient medications  prior to visit.   No facility-administered medications prior to visit.     Per HPI unless specifically indicated in ROS section below Review of Systems  Objective:  BP 120/72    Pulse 66    Temp 98.2 F (36.8 C) (Temporal)    Ht 6\' 1"  (1.854 m)    Wt 193 lb 1 oz (87.6 kg)    SpO2 98%    BMI 25.47 kg/m   Wt Readings from Last 3 Encounters:  12/19/21 193 lb 1 oz (87.6 kg)  09/11/20 185 lb (83.9 kg)  08/12/20 183 lb 8 oz (83.2 kg)      Physical Exam Vitals and nursing note reviewed.  Constitutional:      Appearance: Normal appearance. He is not ill-appearing.  Abdominal:     Hernia: There is no hernia in the left inguinal area or right inguinal area.  Genitourinary:    Testes:        Right: Mass, tenderness, swelling, testicular hydrocele or varicocele not present.        Left: Tenderness, swelling and varicocele present. Mass or testicular hydrocele not present.     Epididymis:     Right: Normal.     Left: Normal.  Musculoskeletal:        General: Tenderness present. No swelling. Normal range of motion.     Right lower leg: No edema.     Left lower leg:  No edema.     Comments:  No pain with int/ext rotation at hip. Bilateral knee exam: No deformity on inspection. No effusion/swelling noted. FROM in flex/extension without crepitus. No popliteal fullness. Discomfort to palpation of left popliteal fossa at asymmetric sinewy band more prominent on left Neg drawer test. Neg mcmurray test. No pain with valgus/varus stress. No PFgrind. No abnormal patellar mobility.   Lymphadenopathy:     Lower Body: No right inguinal adenopathy. No left inguinal adenopathy.  Skin:    General: Skin is warm and dry.     Capillary Refill: Capillary refill takes less than 2 seconds.     Findings: No erythema.  Neurological:     Mental Status: He is alert.  Psychiatric:        Mood and Affect: Mood normal.        Behavior: Behavior normal.      Results for orders placed or performed in  visit on 12/19/21  POCT Urinalysis Dipstick (Automated)  Result Value Ref Range   Color, UA yellow    Clarity, UA cloudy    Glucose, UA Negative Negative   Bilirubin, UA negative    Ketones, UA negative    Spec Grav, UA 1.015 1.010 - 1.025   Blood, UA negative    pH, UA 8.0 5.0 - 8.0   Protein, UA Negative Negative   Urobilinogen, UA 0.2 0.2 or 1.0 E.U./dL   Nitrite, UA negative    Leukocytes, UA Negative Negative   DG Knee Complete 4 Views Left CLINICAL DATA:  Left knee pain after soccer injury.  EXAM: LEFT KNEE - COMPLETE 4+ VIEW  COMPARISON:  July 26, 2019.  FINDINGS: No evidence of fracture, dislocation, or joint effusion. No evidence of arthropathy or other focal bone abnormality. Soft tissues are unremarkable.  IMPRESSION: Negative.  Electronically Signed   By: Lupita Raider M.D.   On: 08/12/2020 16:28   Assessment & Plan:  This visit occurred during the SARS-CoV-2 public health emergency.  Safety protocols were in place, including screening questions prior to the visit, additional usage of staff PPE, and extensive cleaning of exam room while observing appropriate contact time as indicated for disinfecting solutions.   Problem List Items Addressed This Visit     Chronic pain of left knee - Primary    Chronic posterior left knee pain after initial injury while playing soccer. Story suspicious for meniscal tear, however exam is not consistent with this.  He has more pain at popliteal region over sinewy fibrous band that is more prominent than on right.  Xrays from last year reviewed - normal.  Will refer to ortho for further evaluation.       Relevant Orders   Ambulatory referral to Orthopedic Surgery   Left varicocele    Tender varicocele present for over a year. Given pain associated with it, will refer to urology for further evaluation/management Advised stop testosterone booster use given insufficient evidence over risks/benefits.       Relevant  Orders   Ambulatory referral to Urology   Chronic groin pain    Bilateral, L>R.  No inguinal hernia appreciated.  Groin adductor strain r/o sports hernia.  Refer to ortho given chronicity. Pt agrees with plan.       Relevant Orders   Ambulatory referral to Orthopedic Surgery   Other Visit Diagnoses     Pain in left testicle       Relevant Orders   POCT Urinalysis Dipstick (Automated) (Completed)  No orders of the defined types were placed in this encounter.  Orders Placed This Encounter  Procedures   Ambulatory referral to Urology    Referral Priority:   Routine    Referral Type:   Consultation    Referral Reason:   Specialty Services Required    Requested Specialty:   Urology    Number of Visits Requested:   1   Ambulatory referral to Orthopedic Surgery    Referral Priority:   Routine    Referral Type:   Surgical    Referral Reason:   Specialty Services Required    Requested Specialty:   Orthopedic Surgery    Number of Visits Requested:   1   POCT Urinalysis Dipstick (Automated)     Patient Instructions  For left knee and bilateral groin pain we will refer you to ortho for further evaluation.  For left testicular pain, stop testosterone supplement. We will refer you to urology for evaluation.   Follow up plan: Return if symptoms worsen or fail to improve.  Eustaquio Boyden, MD

## 2021-12-20 DIAGNOSIS — G8929 Other chronic pain: Secondary | ICD-10-CM | POA: Insufficient documentation

## 2021-12-20 NOTE — Assessment & Plan Note (Addendum)
Bilateral, L>R.  No inguinal hernia appreciated.  Groin adductor strain r/o sports hernia.  Refer to ortho given chronicity. Pt agrees with plan.

## 2021-12-20 NOTE — Assessment & Plan Note (Signed)
Chronic posterior left knee pain after initial injury while playing soccer. Story suspicious for meniscal tear, however exam is not consistent with this.  He has more pain at popliteal region over sinewy fibrous band that is more prominent than on right.  Xrays from last year reviewed - normal.  Will refer to ortho for further evaluation.

## 2021-12-23 ENCOUNTER — Other Ambulatory Visit: Payer: Self-pay

## 2021-12-23 ENCOUNTER — Ambulatory Visit (INDEPENDENT_AMBULATORY_CARE_PROVIDER_SITE_OTHER): Payer: BC Managed Care – PPO | Admitting: Dermatology

## 2021-12-23 DIAGNOSIS — L309 Dermatitis, unspecified: Secondary | ICD-10-CM | POA: Diagnosis not present

## 2021-12-23 MED ORDER — EUCRISA 2 % EX OINT: TOPICAL_OINTMENT | CUTANEOUS | 1 refills | Status: DC

## 2021-12-23 NOTE — Progress Notes (Signed)
° °  New Patient Visit  Subjective  Jared Suarez is a 26 y.o. male who presents for the following: Rash (Left knee, left arm, left post shoulder. Started red, flaky, and itchy during the summer. Not itchy now, but left a white spot. Using CeraVe Lotion. ). No history or family history of eczema, allergies, asthma.    The following portions of the chart were reviewed this encounter and updated as appropriate:       Review of Systems:  No other skin or systemic complaints except as noted in HPI or Assessment and Plan.  Objective  Well appearing patient in no apparent distress; mood and affect are within normal limits.  A focused examination was performed including face, trunk, extremities. Relevant physical exam findings are noted in the Assessment and Plan.  left knee, left post shoulder, right temple, left elbow Hypopigmented patches on left elbow, left post shoulder, left lat knee; pink scaly patch on right temple.    Assessment & Plan  Dermatitis left knee, left post shoulder, right temple, left elbow  Nummular vrs atopic, with PIPA  Atopic dermatitis (eczema) is a chronic, relapsing, pruritic condition that can significantly affect quality of life. It is often associated with allergic rhinitis and/or asthma and can require treatment with topical medications, phototherapy, or in severe cases biologic injectable medication (Dupixent; Adbry) or Oral JAK inhibitors.  Start Eucrisa Ointment Apply to AA rash face/body qd/bid prn dsp 100g 1Rf.   Reassured pt that pigmentation will gradually return over time  Recommend mild soap and moisturizing cream 1-2 times daily.  Gentle skin care handout provided. Continue CeraVe Lotion/Cream and Dove soap.    Crisaborole (EUCRISA) 2 % OINT - left knee, left post shoulder, right temple, left elbow Apply to affected areas rash on face/body 1-2 times a day until improved.   Return if symptoms worsen or fail to improve.  IJamesetta Orleans, CMA, am  acting as scribe for Brendolyn Patty, MD .  Documentation: I have reviewed the above documentation for accuracy and completeness, and I agree with the above.  Brendolyn Patty MD

## 2021-12-23 NOTE — Patient Instructions (Addendum)
Dry Skin Care ° °What causes dry skin? ° °Dry skin is common and results from inadequate moisture in the outer skin layers. Dry skin usually results from the excessive loss of moisture from the skin surface. This occurs due to two major factors: °Normally the skin's oil glands deposit a layer of oil on the skin's surface. This layer of oil prevents the loss of moisture from the skin. Exposure to soaps, cleaners, solvents, and disinfectants removes this oily film, allowing water to escape. °Water loss from the skin increases when the humidity is low. During winter months we spend a lot of time indoors where the air is heated. Heated air has very low humidity. This also contributes to dry skin. ° °A tendency for dry skin may accompany such disorders as eczema. Also, as people age, the number of functioning oil glands decreases, and the tendency toward dry skin can be a sensation of skin tightness when emerging from the shower. ° °How do I manage dry skin? ° °Humidify your environment. This can be accomplished by using a humidifier in your bedroom at night during winter months. °Bathing can actually put moisture back into your skin if done right. Take the following steps while bathing to sooth dry skin: °Avoid hot water, which only dries the skin and makes itching worse. Use warm water. °Avoid washcloths or extensive rubbing or scrubbing. °Use mild soaps like unscented Dove, Oil of Olay, Cetaphil, Basis, or CeraVe. °If you take baths rather than showers, rinse off soap residue with clean water before getting out of tub. °Once out of the shower/tub, pat dry gently with a soft towel. Leave your skin damp. °While still damp, apply any medicated ointment/cream you were prescribed to the affected areas. After you apply your medicated ointment/cream, then apply your moisturizer to your whole body.This is the most important step in dry skin care. If this is omitted, your skin will continue to be dry. °The choice of  moisturizer is also very important. In general, lotion will not provider enough moisture to severely dry skin because it is water based. You should use an ointment or cream. Moisturizers should also be unscented. Good choices include Vaseline (plain petrolatum), Aquaphor, Cetaphil, CeraVe, Vanicream, DML Forte, Aveeno moisture, or Eucerin Cream. °Bath oils can be helpful, but do not replace the application of moisturizer after the bath. In addition, they make the tub slippery causing an increased risk for falls. Therefore, we do not recommend their use. ° °If You Need Anything After Your Visit ° °If you have any questions or concerns for your doctor, please call our main line at [phone] and press option 4 to reach your doctor's medical assistant. If no one answers, please leave a voicemail as directed and we will return your call as soon as possible. Messages left after 4 pm will be answered the following business day.  ° °You may also send us a message via MyChart. We typically respond to MyChart messages within 1-2 business days. ° °For prescription refills, please ask your pharmacy to contact our office. Our fax number is [phone]. ° °If you have an urgent issue when the clinic is closed that cannot wait until the next business day, you can page your doctor at the number below.   ° °Please note that while we do our best to be available for urgent issues outside of office hours, we are not available 24/7.  ° °If you have an urgent issue and are unable to reach us, you may choose   to seek medical care at your doctor's office, retail clinic, urgent care center, or emergency room. ° °If you have a medical emergency, please immediately call 911 or go to the emergency department. ° °Pager Numbers ° °- Dr. Kowalski: [phone] ° °- Dr. Moye: [phone] ° °- Dr. Stewart: [phone] ° °In the event of inclement weather, please call our main line at [phone] for an update on the status of any delays or  closures. ° °Dermatology Medication Tips: °Please keep the boxes that topical medications come in in order to help keep track of the instructions about where and how to use these. Pharmacies typically print the medication instructions only on the boxes and not directly on the medication tubes.  ° °If your medication is too expensive, please contact our office at [phone] option 4 or send us a message through MyChart.  ° °We are unable to tell what your co-pay for medications will be in advance as this is different depending on your insurance coverage. However, we may be able to find a substitute medication at lower cost or fill out paperwork to get insurance to cover a needed medication.  ° °If a prior authorization is required to get your medication covered by your insurance company, please allow us 1-2 business days to complete this process. ° °Drug prices often vary depending on where the prescription is filled and some pharmacies may offer cheaper prices. ° °The website www.goodrx.com contains coupons for medications through different pharmacies. The prices here do not account for what the cost may be with help from insurance (it may be cheaper with your insurance), but the website can give you the price if you did not use any insurance.  °- You can print the associated coupon and take it with your prescription to the pharmacy.  °- You may also stop by our office during regular business hours and pick up a GoodRx coupon card.  °- If you need your prescription sent electronically to a different pharmacy, notify our office through [facility] MyChart or by phone at [phone] option 4. ° ° ° ° °Si Usted Necesita Algo Después de Su Visita ° °También puede enviarnos un mensaje a través de MyChart. Por lo general respondemos a los mensajes de MyChart en el transcurso de 1 a 2 días hábiles. ° °Para renovar recetas, por favor pida a su farmacia que se ponga en contacto con nuestra oficina. Nuestro número de fax  es el [phone]. ° °Si tiene un asunto urgente cuando la clínica esté cerrada y que no puede esperar hasta el siguiente día hábil, puede llamar/localizar a su doctor(a) al número que aparece a continuación.  ° °Por favor, tenga en cuenta que aunque hacemos todo lo posible para estar disponibles para asuntos urgentes fuera del horario de oficina, no estamos disponibles las 24 horas del día, los 7 días de la semana.  ° °Si tiene un problema urgente y no puede comunicarse con nosotros, puede optar por buscar atención médica  en el consultorio de su doctor(a), en una clínica privada, en un centro de atención urgente o en una sala de emergencias. ° °Si tiene una emergencia médica, por favor llame inmediatamente al 911 o vaya a la sala de emergencias. ° °Números de bíper ° °- Dr. Kowalski: [phone] ° °- Dra. Moye: [phone] ° °- Dra. Stewart: [phone] ° °En caso de inclemencias del tiempo, por favor llame a nuestra línea principal al [phone] para una actualización sobre el estado de cualquier retraso o cierre. ° °Consejos   para la medicación en dermatología: °Por favor, guarde las cajas en las que vienen los medicamentos de uso tópico para ayudarle a seguir las instrucciones sobre dónde y cómo usarlos. Las farmacias generalmente imprimen las instrucciones del medicamento sólo en las cajas y no directamente en los tubos del medicamento.  ° °Si su medicamento es muy caro, por favor, póngase en contacto con nuestra oficina llamando al [phone] y presione la opción 4 o envíenos un mensaje a través de MyChart.  ° °No podemos decirle cuál será su copago por los medicamentos por adelantado ya que esto es diferente dependiendo de la cobertura de su seguro. Sin embargo, es posible que podamos encontrar un medicamento sustituto a menor costo o llenar un formulario para que el seguro cubra el medicamento que se considera necesario.  ° °Si se requiere una autorización previa para que su compañía de seguros cubra  su medicamento, por favor permítanos de 1 a 2 días hábiles para completar este proceso. ° °Los precios de los medicamentos varían con frecuencia dependiendo del lugar de dónde se surte la receta y alguna farmacias pueden ofrecer precios más baratos. ° °El sitio web www.goodrx.com tiene cupones para medicamentos de diferentes farmacias. Los precios aquí no tienen en cuenta lo que podría costar con la ayuda del seguro (puede ser más barato con su seguro), pero el sitio web puede darle el precio si no utilizó ningún seguro.  °- Puede imprimir el cupón correspondiente y llevarlo con su receta a la farmacia.  °- También puede pasar por nuestra oficina durante el horario de atención regular y recoger una tarjeta de cupones de GoodRx.  °- Si necesita que su receta se envíe electrónicamente a una farmacia diferente, informe a nuestra oficina a través de MyChart [facility] o por teléfono llamando al [phone] y presione la opción 4. ° °

## 2022-01-04 ENCOUNTER — Encounter: Payer: Self-pay | Admitting: Family Medicine

## 2022-01-04 DIAGNOSIS — R21 Rash and other nonspecific skin eruption: Secondary | ICD-10-CM | POA: Insufficient documentation

## 2022-01-07 DIAGNOSIS — M25562 Pain in left knee: Secondary | ICD-10-CM | POA: Diagnosis not present

## 2022-01-16 DIAGNOSIS — M25562 Pain in left knee: Secondary | ICD-10-CM | POA: Diagnosis not present

## 2022-01-21 DIAGNOSIS — M25562 Pain in left knee: Secondary | ICD-10-CM | POA: Diagnosis not present

## 2023-03-20 ENCOUNTER — Emergency Department (HOSPITAL_COMMUNITY): Payer: Self-pay

## 2023-03-20 ENCOUNTER — Emergency Department (HOSPITAL_COMMUNITY)
Admission: EM | Admit: 2023-03-20 | Discharge: 2023-03-20 | Disposition: A | Discharge status: Home / Self Care | Payer: Self-pay | Attending: Emergency Medicine | Admitting: Emergency Medicine

## 2023-03-20 DIAGNOSIS — S8991XA Unspecified injury of right lower leg, initial encounter: Secondary | ICD-10-CM

## 2023-03-20 DIAGNOSIS — W501XXA Accidental kick by another person, initial encounter: Secondary | ICD-10-CM | POA: Insufficient documentation

## 2023-03-20 DIAGNOSIS — Y9366 Activity, soccer: Secondary | ICD-10-CM | POA: Insufficient documentation

## 2023-03-20 DIAGNOSIS — M25561 Pain in right knee: Secondary | ICD-10-CM | POA: Insufficient documentation

## 2023-03-20 NOTE — ED Triage Notes (Signed)
Patient complains of right knee pain after an injury last night playing soccer. Denies any pain when seated, pain comes when weight bearing. Patient is alert, oriented, and in no apparent distress at this time.

## 2023-03-20 NOTE — Discharge Instructions (Addendum)
It was a pleasure taking care of you!   Your x-ray was negative for fracture or dislocation.  You may take over the counter 600 mg Ibuprofen every 6 hours and alternate with 500 mg Tylenol every 6 hours as needed for pain for no more than 7 days.  You will be given a knee immobilizer and crutches, wear it during the day to aid with walking.  You may remove it at night.  You may apply ice to affected area for up to 15 minutes at a time. Ensure to place a barrier between your skin and the ice.  Attached is information for the on-call orthopedist as well as [facility].  You may call and set up a follow-up appointment. You may follow-up with your primary care provider as needed.  Return to the Emergency Department if you are experiencing increasing/worsening pain, swelling, color change, fever, or worsening symptoms.

## 2023-03-20 NOTE — ED Provider Notes (Signed)
[facility] Provider Note   CSN: [id number] Arrival date & time: 03/20/23  1116     History  Chief Complaint  Patient presents with   Knee Injury    Jared Suarez is a 27 y.o. male who presents emergency department concerns for right knee injury last night. Patient notes that he was playing soccer when he jumped up and landed incorrectly on his right leg as well as had an opposing team member landed on his right knee.  Notes that he felt a crunching/cracking.  Has tried ibuprofen at home with mild relief of his symptoms.  Notes his pain is exacerbated with weightbearing. Denies any his head, LOC, nausea, vomiting.  The history is provided by the patient. No language interpreter was used.       Home Medications Prior to Admission medications   Medication Sig Start Date End Date Taking? Authorizing Provider  Crisaborole (EUCRISA) 2 % OINT Apply to affected areas rash on face/body 1-2 times a day until improved. 12/23/21   Brendolyn Patty, MD      Allergies    Rocephin [ceftriaxone sodium in dextrose]    Review of Systems   Review of Systems  All other systems reviewed and are negative.   Physical Exam Updated Vital Signs BP 128/85 (BP Location: Right Arm)   Pulse (!) 58   Temp 98.2 F (36.8 C)   Resp 19   SpO2 99%  Physical Exam Vitals and nursing note reviewed.  Constitutional:      General: He is not in acute distress.    Appearance: Normal appearance.  Eyes:     General: No scleral icterus.    Extraocular Movements: Extraocular movements intact.  Cardiovascular:     Rate and Rhythm: Normal rate.  Pulmonary:     Effort: Pulmonary effort is normal. No respiratory distress.  Abdominal:     Palpations: Abdomen is soft. There is no mass.     Tenderness: There is no abdominal tenderness.  Musculoskeletal:        General: Normal range of motion.     Cervical back: Neck supple.     Comments: Tenderness to palpation to  right lateral and medial tibial plateau.  Mild effusion noted to the medial aspect of right knee.  No obvious deformity, erythema, or swelling.  Decreased flexion and extension of right knee secondary to pain. No tenderness to palpation with resisted flexion and extension of right knee.  No TTP noted to RLE, ankle, or foot with FROM. No spinal tenderness to palpation.  No tenderness to palpation noted to musculature of spine.  Skin:    General: Skin is warm and dry.     Findings: No bruising, erythema or rash.  Neurological:     Mental Status: He is alert.     Sensory: Sensation is intact.     Motor: Motor function is intact.  Psychiatric:        Behavior: Behavior normal.     ED Results / Procedures / Treatments   Labs (all labs ordered are listed, but only abnormal results are displayed) Labs Reviewed - No data to display  EKG None  Radiology DG Knee Complete 4 Views Right  Result Date: 03/20/2023 CLINICAL DATA:  27 year old male with history of trauma complaining of right knee pain. EXAM: RIGHT KNEE - COMPLETE 4+ VIEW COMPARISON:  No priors. FINDINGS: No evidence of fracture, dislocation, or joint effusion. No evidence of arthropathy or other focal bone abnormality.  Soft tissues are unremarkable. IMPRESSION: Negative. Electronically Signed   By: Vinnie Langton M.D.   On: 03/20/2023 12:05    Procedures Procedures    Medications Ordered in ED Medications - No data to display  ED Course/ Medical Decision Making/ A&P Clinical Course as of 03/20/23 1326  Sat Mar 20, 2023  1210 Discussed with patient imaging findings. Discussed with patient and family discharge treatment plan. Answered all available questions. Pt appears safe for discharge at this time.  [SB]    Clinical Course User Index [SB] Shanyce Daris A, PA-C                             Medical Decision Making Amount and/or Complexity of Data Reviewed Radiology: ordered.   Patient with right knee pain onset last  night while playing soccer. Vital signs patient afebrile. On exam, patient with Tenderness to palpation to right lateral and medial tibial plateau.  Mild effusion noted to the medial aspect of right knee.  No obvious deformity, erythema, or swelling.  Decreased flexion and extension of right knee secondary to pain. No tenderness to palpation with resisted flexion and extension of right knee.  No spinal tenderness to palpation.  No tenderness to palpation noted to musculature of spine. Differential diagnosis includes fracture, dislocation, sprain, effusion.    Imaging: I ordered imaging studies including right knee x-ray I independently visualized and interpreted imaging which showed: Negative for acute findings I agree with the radiologist interpretation  Disposition: Presentation suspicious for likely sprain and effusion of the right knee.  Doubt concerns at this time for fracture or dislocation. After consideration of the diagnostic results and the patients response to treatment, I feel that the patient would benefit from Discharge home.  Knee immobilizer and crutches given to patient today.  Patient provided with information for orthopedics for follow-up.  Work note provided.  Supportive care measures and strict return precautions discussed with patient at bedside. Pt acknowledges and verbalizes understanding. Pt appears safe for discharge. Follow up as indicated in discharge paperwork.    This chart was dictated using voice recognition software, Dragon. Despite the best efforts of this provider to proofread and correct errors, errors may still occur which can change documentation meaning.   Final Clinical Impression(s) / ED Diagnoses Final diagnoses:  Injury of right knee, initial encounter    Rx / DC Orders ED Discharge Orders     None         Reather Steller A, PA-C 03/20/23 1326    Horton, Alvin Critchley, DO 03/20/23 2131

## 2023-03-23 ENCOUNTER — Telehealth: Payer: Self-pay

## 2023-03-23 NOTE — Transitions of Care (Post Inpatient/ED Visit) (Signed)
   03/23/2023  Name: Jared Suarez MRN: [id number] DOB: May 08, 1996  Today's TOC FU Call Status: Today's TOC FU Call Status:: Successful TOC FU Call Competed TOC FU Call Complete Date: 03/23/23  Transition Care Management Follow-up Telephone Call Date of Discharge: 03/20/23 [facility] Type of Discharge: Emergency Department Reason for ED Visit: Other: (Injury of right knee) How have you been since you were released from the hospital?: Same Any questions or concerns?: No  Items Reviewed: Did you receive and understand the discharge instructions provided?: Yes Medications obtained and verified?: Yes (Medications Reviewed) Any new allergies since your discharge?: No Dietary orders reviewed?: NA Do you have support at home?: Yes  Home Care and Equipment/Supplies: [place] Ordered?: NA Any new equipment or medical supplies ordered?: NA  Functional Questionnaire: Do you need assistance with bathing/showering or dressing?: No Do you need assistance with meal preparation?: No Do you need assistance with eating?: No Do you have difficulty maintaining continence: No Do you need assistance with getting out of bed/getting out of a chair/moving?: No Do you have difficulty managing or taking your medications?: No  Follow up appointments reviewed: PCP Follow-up appointment confirmed?: No (declined- will follow up with Ortho) MD Provider Line Number:[phone] Given: Yes [facility] Follow-up appointment confirmed?: Yes Date of Specialist follow-up appointment?: 03/31/23 Follow-Up Specialty Provider:: [facility] Do you need transportation to your follow-up appointment?: No Do you understand care options if your condition(s) worsen?: Yes-patient verbalized understanding    Norton Blizzard, [place] ([place])  [place] [phone]

## 2023-05-24 DIAGNOSIS — Z01818 Encounter for other preprocedural examination: Secondary | ICD-10-CM | POA: Diagnosis not present

## 2023-05-25 DIAGNOSIS — S83281A Other tear of lateral meniscus, current injury, right knee, initial encounter: Secondary | ICD-10-CM | POA: Diagnosis not present

## 2023-05-25 DIAGNOSIS — G8918 Other acute postprocedural pain: Secondary | ICD-10-CM | POA: Diagnosis not present

## 2023-05-25 DIAGNOSIS — S83511A Sprain of anterior cruciate ligament of right knee, initial encounter: Secondary | ICD-10-CM | POA: Diagnosis not present

## 2023-05-26 ENCOUNTER — Other Ambulatory Visit: Payer: Self-pay

## 2023-05-26 ENCOUNTER — Emergency Department (HOSPITAL_COMMUNITY)
Admission: EM | Admit: 2023-05-26 | Discharge: 2023-05-26 | Disposition: A | Discharge status: Home / Self Care | Payer: BC Managed Care – PPO | Attending: Emergency Medicine | Admitting: Emergency Medicine

## 2023-05-26 ENCOUNTER — Emergency Department (HOSPITAL_COMMUNITY): Payer: BC Managed Care – PPO

## 2023-05-26 DIAGNOSIS — G8918 Other acute postprocedural pain: Secondary | ICD-10-CM

## 2023-05-26 DIAGNOSIS — I10 Essential (primary) hypertension: Secondary | ICD-10-CM | POA: Diagnosis not present

## 2023-05-26 DIAGNOSIS — M25561 Pain in right knee: Secondary | ICD-10-CM | POA: Insufficient documentation

## 2023-05-26 LAB — CBC WITH DIFFERENTIAL/PLATELET
Abs Immature Granulocytes: 0.04 10*3/uL (ref 0.00–0.07)
Basophils Absolute: 0 10*3/uL (ref 0.0–0.1)
Basophils Relative: 0 %
Eosinophils Absolute: 0 10*3/uL (ref 0.0–0.5)
Eosinophils Relative: 0 %
HCT: 40.7 % (ref 39.0–52.0)
Hemoglobin: 14.6 g/dL (ref 13.0–17.0)
Immature Granulocytes: 0 %
Lymphocytes Relative: 16 %
Lymphs Abs: 2 10*3/uL (ref 0.7–4.0)
MCH: 29.5 pg (ref 26.0–34.0)
MCHC: 35.9 g/dL (ref 30.0–36.0)
MCV: 82.2 fL (ref 80.0–100.0)
Monocytes Absolute: 1.3 10*3/uL — ABNORMAL HIGH (ref 0.1–1.0)
Monocytes Relative: 11 %
Neutro Abs: 8.6 10*3/uL — ABNORMAL HIGH (ref 1.7–7.7)
Neutrophils Relative %: 73 %
Platelets: 234 10*3/uL (ref 150–400)
RBC: 4.95 MIL/uL (ref 4.22–5.81)
RDW: 11.5 % (ref 11.5–15.5)
WBC: 11.9 10*3/uL — ABNORMAL HIGH (ref 4.0–10.5)
nRBC: 0 % (ref 0.0–0.2)

## 2023-05-26 LAB — BASIC METABOLIC PANEL
Anion gap: 10 (ref 5–15)
BUN: 15 mg/dL (ref 6–20)
CO2: 25 mmol/L (ref 22–32)
Calcium: 8.9 mg/dL (ref 8.9–10.3)
Chloride: 100 mmol/L (ref 98–111)
Creatinine, Ser: 1.11 mg/dL (ref 0.61–1.24)
GFR, Estimated: >60 mL/min (ref >60)
Glucose, Bld: 114 mg/dL — ABNORMAL HIGH (ref 70–99)
Potassium: 3.4 mmol/L — ABNORMAL LOW (ref 3.5–5.1)
Sodium: 135 mmol/L (ref 135–145)

## 2023-05-26 MED ORDER — ONDANSETRON HCL 4 MG/2ML IJ SOLN
4.0000 mg | Freq: Once | INTRAMUSCULAR | Status: AC
  Administered 2023-05-26: 4 mg via INTRAVENOUS
  Filled 2023-05-26: qty 2

## 2023-05-26 MED ORDER — HYDROMORPHONE HCL 1 MG/ML IJ SOLN
0.5000 mg | Freq: Once | INTRAMUSCULAR | Status: AC
  Administered 2023-05-26: 0.5 mg via INTRAVENOUS
  Filled 2023-05-26: qty 1

## 2023-05-26 MED ORDER — DIAZEPAM 5 MG/ML IJ SOLN
2.0000 mg | Freq: Once | INTRAMUSCULAR | Status: AC
  Administered 2023-05-26: 2 mg via INTRAVENOUS
  Filled 2023-05-26: qty 2

## 2023-05-26 MED ORDER — OXYCODONE HCL 10 MG PO TABS: 10.0000 mg | ORAL_TABLET | ORAL | 0 refills | Status: AC

## 2023-05-26 MED ORDER — DIAZEPAM 5 MG/ML IJ SOLN
1.0000 mg | Freq: Once | INTRAMUSCULAR | Status: AC
  Administered 2023-05-26: 1 mg via INTRAVENOUS
  Filled 2023-05-26: qty 2

## 2023-05-26 NOTE — Discharge Instructions (Signed)
I suspect this is postoperative pain, I have given you 10 mg of oxycodone, you may take this in conjunction with one of your 5 mg of oxycodone for a total of 15 mg.  You may take this every 4 hours for pain, I recommend staying consistent to stay head of the pain.  You may also supplement with ibuprofen and Tylenol, please follow dosage instructions on the back of these bottles.  Please be aware that oxycodone is a narcotic and can lower your respiratory drive, if you note that you are becoming lethargic, have decreased respirations, you need to discontinue the medication immediately and call 911 for further evaluation.  I have given you a short course of narcotics please take as prescribed.  This medication can make you drowsy do not consume alcohol or operate heavy machinery when taking this medication.    Please call your orthopedic doctor for further assessment  Come back to the emergency department if you develop chest pain, shortness of breath, severe abdominal pain, uncontrolled nausea, vomiting, diarrhea.

## 2023-05-26 NOTE — ED Notes (Signed)
DC instructions reviewed with pt. PT verbalized understanding. PT DC °

## 2023-05-26 NOTE — ED Triage Notes (Signed)
Pt arrives via [facility] from home. Had surgery to his right leg at [facility] yesterday. Since discharge pt endorses having uncontrolled pain with new onset of fevers. Pt warm to touch on arrival with oral T 98.2. Has been taking tylenol at home.

## 2023-05-26 NOTE — ED Provider Notes (Signed)
[place] EMERGENCY DEPARTMENT AT [facility] Provider Note   CSN: [id number] Arrival date & time: 05/26/23  0349     History  Chief Complaint  Patient presents with   Post-op Problem    Jared Suarez is a 27 y.o. male.  HPI   Patient without significant medical history presented with complaints of postop pain.  Patient states that he underwent right ACL reconstruction with quadricep tendon autograft as well as meniscus repair yesterday with [facility] by Dr. Ross Marcus.  Patient states that he was discharged after the surgery and states that since being discharged she was having pain in his right knee and some paresthesias in his right lower leg,  states that the pain was bearable, states he was taking oxycodone which kept it around a 7 but the pain had gotten worse with  muscle spasms, he states the paresthesias in his in his entire foot and lower leg but this has remained constant and unchanged since the surgery.  He states he is still able to move his toes and ankle without difficulty.  He notes that he did have a fever after the surgery, but has not noticed any drainage or discharge coming from the wound.  Patient is he was taking oxycodone without any relief and was told by his orthopedic doctor to come in for further assessment.  Home Medications Prior to Admission medications   Medication Sig Start Date End Date Taking? Authorizing Provider  Oxycodone HCl 10 MG TABS Take 1 tablet (10 mg total) by mouth every 4 (four) hours for 4 days. 05/26/23 05/30/23 Yes Carroll Sage, PA-C  Crisaborole (EUCRISA) 2 % OINT Apply to affected areas rash on face/body 1-2 times a day until improved. 12/23/21   Willeen Niece, MD      Allergies    Rocephin [ceftriaxone sodium in dextrose]    Review of Systems   Review of Systems  Constitutional:  Negative for chills and fever.  Respiratory:  Negative for shortness of breath.   Cardiovascular:  Negative for chest pain.   Gastrointestinal:  Negative for abdominal pain.  Musculoskeletal:        Knee pain  Neurological:  Negative for headaches.    Physical Exam Updated Vital Signs BP 124/67   Pulse 92   Temp 98.5 F (36.9 C) (Oral)   Resp 18   Ht 6\' 1"  (1.854 m)   Wt 86.2 kg   SpO2 100%   BMI 25.07 kg/m  Physical Exam Vitals and nursing note reviewed.  Constitutional:      General: He is not in acute distress.    Appearance: He is not ill-appearing.  HENT:     Head: Normocephalic and atraumatic.     Nose: No congestion.  Eyes:     Conjunctiva/sclera: Conjunctivae normal.  Cardiovascular:     Rate and Rhythm: Normal rate and regular rhythm.     Pulses: Normal pulses.  Pulmonary:     Effort: Pulmonary effort is normal.  Musculoskeletal:     Comments: Focused exam of the right knee reveals surgical wounds which were closed, without any drainage or discharge, no noted edema or erythema, able to move his toes and ankle without difficulty, he has noted point tenderness on the lateral aspect of his knee, no deformities present.  He has sensation intact to light touch, 2-second capillary refill, 2+ dorsal pedal pulses, all compartments are soft.  Skin:    General: Skin is warm and dry.  Neurological:  Mental Status: He is alert.  Psychiatric:        Mood and Affect: Mood normal.     ED Results / Procedures / Treatments   Labs (all labs ordered are listed, but only abnormal results are displayed) Labs Reviewed  BASIC METABOLIC PANEL - Abnormal; Notable for the following components:      Result Value   Potassium 3.4 (*)    Glucose, Bld 114 (*)    All other components within normal limits  CBC WITH DIFFERENTIAL/PLATELET - Abnormal; Notable for the following components:   WBC 11.9 (*)    Neutro Abs 8.6 (*)    Monocytes Absolute 1.3 (*)    All other components within normal limits    EKG None  Radiology DG Knee Complete 4 Views Right  Result Date: 05/26/2023 CLINICAL DATA:   Postoperative pain. EXAM: RIGHT KNEE - COMPLETE 4+ VIEW COMPARISON:  03/20/2023 FINDINGS: Cysts postoperative changes identified from ACL repair. Soft tissue gas is noted about the knee. No signs of acute fracture or dislocation. No significant arthropathy. IMPRESSION: Status post ACL repair. Electronically Signed   By: Signa Kell M.D.   On: 05/26/2023 05:24    Procedures Procedures    Medications Ordered in ED Medications  ondansetron (ZOFRAN) injection 4 mg (4 mg Intravenous Given 05/26/23 0507)  HYDROmorphone (DILAUDID) injection 0.5 mg (0.5 mg Intravenous Given 05/26/23 0507)  diazepam (VALIUM) injection 2 mg (2 mg Intravenous Given 05/26/23 0508)  diazepam (VALIUM) injection 1 mg (1 mg Intravenous Given 05/26/23 0636)  HYDROmorphone (DILAUDID) injection 0.5 mg (0.5 mg Intravenous Given 05/26/23 0636)    ED Course/ Medical Decision Making/ A&P                             Medical Decision Making Amount and/or Complexity of Data Reviewed Labs: ordered. Radiology: ordered.  Risk Prescription drug management.   This patient presents to the ED for concern of knee pain, this involves an extensive number of treatment options, and is a complaint that carries with it a high risk of complications and morbidity.  The differential diagnosis includes compartment syndrome, limb ischemia, postop pain, infection    Additional history obtained:  Additional history obtained from mother at bedside External records from outside source obtained and reviewed including recent orthopedic note   Co morbidities that complicate the patient evaluation  Postop day 1  Social Determinants of Health:  N/A   Lab Tests:  I Ordered, and personally interpreted labs.  The pertinent results include: CBC shows leukocytosis 11.9, BMP reveals potassium 3.4, glucose 114   Imaging Studies ordered:  I ordered imaging studies including x-ray of the right knee I independently visualized and interpreted  imaging which showed negative acute findings I agree with the radiologist interpretation   Cardiac Monitoring:  The patient was maintained on a cardiac monitor.  I personally viewed and interpreted the cardiac monitored which showed an underlying rhythm of: N/A   Medicines ordered and prescription drug management:  I ordered medication including antiemetics, analgesics, benzodiazepine I have reviewed the patients home medicines and have made adjustments as needed  Critical Interventions:  N/A   Reevaluation:  Presents with pain in the right knee, suspect this is likely postop pain, will obtain screening lab workup imaging provide with pain management and reassess.  Reassessed the patient after pain medication states he feels much better, states that he is agreement discharge at this time    Consultations  Obtained:  N/A    Test Considered:  N/a    Rule out I have low suspicion for septic arthritis as be very atypical to develop an infection less than 24 hours after operation there is also no evidence of infection no drainage no discharge no erythema no on my exam.  Patient does have a slight leukocytosis suspect this more acute phase reactant postsurgery he is afebrile nontachycardic.  Low suspicion for fracture or dislocation as x-ray does not feel any significant findings.  Low suspicion for compartment syndrome as area was palpated it was soft to the touch, neurovascular fully intact.  Suspicion for limb ischemia is low at this time as he is neurovascular intact, sensation tact light touch, he has decent capillary refill, 2+ dorsal pedal pulses motor function is grossly intact.  Doubt DVT as again presentation patient atypical to develop after surgery less than 24 hours she has no calf tenderness or palpable cords.     Dispostion and problem list  After consideration of the diagnostic results and the patients response to treatment, I feel that the patent would benefit  from discharge.  Knee pain-likely this is just postop pain, will patient's pain medications, given strict return precaution follow-up with orthopedics for further assessment.           Final Clinical Impression(s) / ED Diagnoses Final diagnoses:  Post-operative pain    Rx / DC Orders ED Discharge Orders          Ordered    Oxycodone HCl 10 MG TABS  Every 4 hours        05/26/23 0703              Carroll Sage, PA-C 05/26/23 4098    Tilden Fossa, MD 05/27/23 [phone]

## 2023-05-27 ENCOUNTER — Telehealth: Payer: Self-pay

## 2023-05-27 NOTE — Transitions of Care (Post Inpatient/ED Visit) (Signed)
   05/27/2023  Name: Jared Suarez MRN: [id number] DOB: Mar 23, 1996  Today's TOC FU Call Status: Today's TOC FU Call Status:: Successful TOC FU Call Competed TOC FU Call Complete Date: 05/27/23  Transition Care Management Follow-up Telephone Call Date of Discharge: 05/26/23 Discharge Facility: [facility]) Type of Discharge: Emergency Department Reason for ED Visit: Other: (post -op  ACL) How have you been since you were released from the hospital?: Better Any questions or concerns?: No  Items Reviewed: Did you receive and understand the discharge instructions provided?: Yes Medications obtained,verified, and reconciled?: Yes (Medications Reviewed) Any new allergies since your discharge?: No Dietary orders reviewed?: NA Do you have support at home?: Yes People in Home: parent(s)  Medications Reviewed Today: Medications Reviewed Today     Reviewed by Annabell Sabal, CMA (Certified Medical Assistant) on 05/27/23 at [phone]  Med List Status: <None>   Medication Order Taking? Sig Documenting Provider Last Dose Status Informant  Crisaborole (EUCRISA) 2 % OINT [id number] Yes Apply to affected areas rash on face/body 1-2 times a day until improved. Willeen Niece, MD Taking Active   cyclobenzaprine (FLEXERIL) 5 MG tablet [id number] Yes Take by mouth. [provider] Taking Active   Oxycodone HCl 10 MG TABS [id number] Yes Take 1 tablet (10 mg total) by mouth every 4 (four) hours for 4 days. Carroll Sage, PA-C Taking Active             Home Care and Equipment/Supplies: Were Home Health Services Ordered?: No Any new equipment or medical supplies ordered?: No  Functional Questionnaire: Do you need assistance with bathing/showering or dressing?: No Do you need assistance with meal preparation?: Yes Do you need assistance with eating?: No Do you have difficulty maintaining continence: No Do you need assistance with getting out of bed/getting out of  a chair/moving?: No Do you have difficulty managing or taking your medications?: No  Follow up appointments reviewed: PCP Follow-up appointment confirmed?: NA Specialist Hospital Follow-up appointment confirmed?: Yes Date of Specialist follow-up appointment?: 06/01/23 Follow-Up Specialty Provider:: Dr. Okey Dupre Do you need transportation to your follow-up appointment?: No Do you understand care options if your condition(s) worsen?: Yes-patient verbalized understanding    SIGNATURE Fredirick Maudlin

## 2023-05-28 DIAGNOSIS — Z9889 Other specified postprocedural states: Secondary | ICD-10-CM | POA: Diagnosis not present

## 2023-05-28 DIAGNOSIS — R600 Localized edema: Secondary | ICD-10-CM | POA: Diagnosis not present

## 2023-05-28 DIAGNOSIS — M25661 Stiffness of right knee, not elsewhere classified: Secondary | ICD-10-CM | POA: Diagnosis not present

## 2023-05-28 DIAGNOSIS — M25561 Pain in right knee: Secondary | ICD-10-CM | POA: Diagnosis not present

## 2023-06-01 DIAGNOSIS — M25661 Stiffness of right knee, not elsewhere classified: Secondary | ICD-10-CM | POA: Diagnosis not present

## 2023-06-01 DIAGNOSIS — M25561 Pain in right knee: Secondary | ICD-10-CM | POA: Diagnosis not present

## 2023-06-01 DIAGNOSIS — R600 Localized edema: Secondary | ICD-10-CM | POA: Diagnosis not present

## 2023-06-03 DIAGNOSIS — M25661 Stiffness of right knee, not elsewhere classified: Secondary | ICD-10-CM | POA: Diagnosis not present

## 2023-06-03 DIAGNOSIS — R600 Localized edema: Secondary | ICD-10-CM | POA: Diagnosis not present

## 2023-06-03 DIAGNOSIS — M25561 Pain in right knee: Secondary | ICD-10-CM | POA: Diagnosis not present

## 2023-06-08 DIAGNOSIS — R600 Localized edema: Secondary | ICD-10-CM | POA: Diagnosis not present

## 2023-06-08 DIAGNOSIS — M25561 Pain in right knee: Secondary | ICD-10-CM | POA: Diagnosis not present

## 2023-06-08 DIAGNOSIS — M25661 Stiffness of right knee, not elsewhere classified: Secondary | ICD-10-CM | POA: Diagnosis not present

## 2023-06-10 DIAGNOSIS — Z4889 Encounter for other specified surgical aftercare: Secondary | ICD-10-CM | POA: Diagnosis not present

## 2023-06-10 DIAGNOSIS — M25561 Pain in right knee: Secondary | ICD-10-CM | POA: Diagnosis not present

## 2023-06-10 DIAGNOSIS — M25661 Stiffness of right knee, not elsewhere classified: Secondary | ICD-10-CM | POA: Diagnosis not present

## 2023-06-10 DIAGNOSIS — R600 Localized edema: Secondary | ICD-10-CM | POA: Diagnosis not present

## 2023-06-14 DIAGNOSIS — R600 Localized edema: Secondary | ICD-10-CM | POA: Diagnosis not present

## 2023-06-14 DIAGNOSIS — M25661 Stiffness of right knee, not elsewhere classified: Secondary | ICD-10-CM | POA: Diagnosis not present

## 2023-06-14 DIAGNOSIS — M25561 Pain in right knee: Secondary | ICD-10-CM | POA: Diagnosis not present

## 2023-06-17 DIAGNOSIS — M25561 Pain in right knee: Secondary | ICD-10-CM | POA: Diagnosis not present

## 2023-06-17 DIAGNOSIS — R600 Localized edema: Secondary | ICD-10-CM | POA: Diagnosis not present

## 2023-06-17 DIAGNOSIS — M25661 Stiffness of right knee, not elsewhere classified: Secondary | ICD-10-CM | POA: Diagnosis not present

## 2023-06-21 DIAGNOSIS — M25561 Pain in right knee: Secondary | ICD-10-CM | POA: Diagnosis not present

## 2023-06-21 DIAGNOSIS — R600 Localized edema: Secondary | ICD-10-CM | POA: Diagnosis not present

## 2023-06-21 DIAGNOSIS — M25661 Stiffness of right knee, not elsewhere classified: Secondary | ICD-10-CM | POA: Diagnosis not present

## 2023-06-23 DIAGNOSIS — M25561 Pain in right knee: Secondary | ICD-10-CM | POA: Diagnosis not present

## 2023-06-23 DIAGNOSIS — R600 Localized edema: Secondary | ICD-10-CM | POA: Diagnosis not present

## 2023-06-23 DIAGNOSIS — M25661 Stiffness of right knee, not elsewhere classified: Secondary | ICD-10-CM | POA: Diagnosis not present

## 2023-06-30 DIAGNOSIS — M25561 Pain in right knee: Secondary | ICD-10-CM | POA: Diagnosis not present

## 2023-06-30 DIAGNOSIS — M25661 Stiffness of right knee, not elsewhere classified: Secondary | ICD-10-CM | POA: Diagnosis not present

## 2023-06-30 DIAGNOSIS — R600 Localized edema: Secondary | ICD-10-CM | POA: Diagnosis not present

## 2023-07-02 DIAGNOSIS — M25661 Stiffness of right knee, not elsewhere classified: Secondary | ICD-10-CM | POA: Diagnosis not present

## 2023-07-02 DIAGNOSIS — M25561 Pain in right knee: Secondary | ICD-10-CM | POA: Diagnosis not present

## 2023-07-02 DIAGNOSIS — R600 Localized edema: Secondary | ICD-10-CM | POA: Diagnosis not present

## 2023-07-06 DIAGNOSIS — M25561 Pain in right knee: Secondary | ICD-10-CM | POA: Diagnosis not present

## 2023-07-06 DIAGNOSIS — M25661 Stiffness of right knee, not elsewhere classified: Secondary | ICD-10-CM | POA: Diagnosis not present

## 2023-07-06 DIAGNOSIS — R600 Localized edema: Secondary | ICD-10-CM | POA: Diagnosis not present

## 2023-07-08 DIAGNOSIS — M25661 Stiffness of right knee, not elsewhere classified: Secondary | ICD-10-CM | POA: Diagnosis not present

## 2023-07-08 DIAGNOSIS — R600 Localized edema: Secondary | ICD-10-CM | POA: Diagnosis not present

## 2023-07-08 DIAGNOSIS — M25561 Pain in right knee: Secondary | ICD-10-CM | POA: Diagnosis not present

## 2023-07-12 DIAGNOSIS — R600 Localized edema: Secondary | ICD-10-CM | POA: Diagnosis not present

## 2023-07-12 DIAGNOSIS — M25561 Pain in right knee: Secondary | ICD-10-CM | POA: Diagnosis not present

## 2023-07-12 DIAGNOSIS — M25661 Stiffness of right knee, not elsewhere classified: Secondary | ICD-10-CM | POA: Diagnosis not present

## 2023-07-14 DIAGNOSIS — M25561 Pain in right knee: Secondary | ICD-10-CM | POA: Diagnosis not present

## 2023-07-14 DIAGNOSIS — R600 Localized edema: Secondary | ICD-10-CM | POA: Diagnosis not present

## 2023-07-14 DIAGNOSIS — M25661 Stiffness of right knee, not elsewhere classified: Secondary | ICD-10-CM | POA: Diagnosis not present

## 2023-07-19 DIAGNOSIS — M25561 Pain in right knee: Secondary | ICD-10-CM | POA: Diagnosis not present

## 2023-07-19 DIAGNOSIS — R600 Localized edema: Secondary | ICD-10-CM | POA: Diagnosis not present

## 2023-07-19 DIAGNOSIS — M25661 Stiffness of right knee, not elsewhere classified: Secondary | ICD-10-CM | POA: Diagnosis not present

## 2023-07-21 DIAGNOSIS — M25561 Pain in right knee: Secondary | ICD-10-CM | POA: Diagnosis not present

## 2023-07-21 DIAGNOSIS — M25661 Stiffness of right knee, not elsewhere classified: Secondary | ICD-10-CM | POA: Diagnosis not present

## 2023-07-21 DIAGNOSIS — R600 Localized edema: Secondary | ICD-10-CM | POA: Diagnosis not present

## 2023-07-28 DIAGNOSIS — R600 Localized edema: Secondary | ICD-10-CM | POA: Diagnosis not present

## 2023-07-28 DIAGNOSIS — M25561 Pain in right knee: Secondary | ICD-10-CM | POA: Diagnosis not present

## 2023-07-28 DIAGNOSIS — M25661 Stiffness of right knee, not elsewhere classified: Secondary | ICD-10-CM | POA: Diagnosis not present

## 2023-07-30 DIAGNOSIS — M25561 Pain in right knee: Secondary | ICD-10-CM | POA: Diagnosis not present

## 2023-07-30 DIAGNOSIS — R600 Localized edema: Secondary | ICD-10-CM | POA: Diagnosis not present

## 2023-07-30 DIAGNOSIS — M25661 Stiffness of right knee, not elsewhere classified: Secondary | ICD-10-CM | POA: Diagnosis not present

## 2023-08-04 DIAGNOSIS — M25561 Pain in right knee: Secondary | ICD-10-CM | POA: Diagnosis not present

## 2023-08-04 DIAGNOSIS — M25661 Stiffness of right knee, not elsewhere classified: Secondary | ICD-10-CM | POA: Diagnosis not present

## 2023-08-04 DIAGNOSIS — R600 Localized edema: Secondary | ICD-10-CM | POA: Diagnosis not present

## 2023-08-06 DIAGNOSIS — R600 Localized edema: Secondary | ICD-10-CM | POA: Diagnosis not present

## 2023-08-06 DIAGNOSIS — M25561 Pain in right knee: Secondary | ICD-10-CM | POA: Diagnosis not present

## 2023-08-06 DIAGNOSIS — M25661 Stiffness of right knee, not elsewhere classified: Secondary | ICD-10-CM | POA: Diagnosis not present

## 2023-08-06 DIAGNOSIS — S83511A Sprain of anterior cruciate ligament of right knee, initial encounter: Secondary | ICD-10-CM | POA: Diagnosis not present

## 2023-08-10 DIAGNOSIS — M25661 Stiffness of right knee, not elsewhere classified: Secondary | ICD-10-CM | POA: Diagnosis not present

## 2023-08-10 DIAGNOSIS — M25561 Pain in right knee: Secondary | ICD-10-CM | POA: Diagnosis not present

## 2023-08-10 DIAGNOSIS — R600 Localized edema: Secondary | ICD-10-CM | POA: Diagnosis not present

## 2023-08-12 DIAGNOSIS — M25661 Stiffness of right knee, not elsewhere classified: Secondary | ICD-10-CM | POA: Diagnosis not present

## 2023-08-12 DIAGNOSIS — M25561 Pain in right knee: Secondary | ICD-10-CM | POA: Diagnosis not present

## 2023-08-12 DIAGNOSIS — R600 Localized edema: Secondary | ICD-10-CM | POA: Diagnosis not present

## 2023-08-17 DIAGNOSIS — M25561 Pain in right knee: Secondary | ICD-10-CM | POA: Diagnosis not present

## 2023-08-17 DIAGNOSIS — M25661 Stiffness of right knee, not elsewhere classified: Secondary | ICD-10-CM | POA: Diagnosis not present

## 2023-08-17 DIAGNOSIS — R600 Localized edema: Secondary | ICD-10-CM | POA: Diagnosis not present

## 2023-08-19 DIAGNOSIS — R600 Localized edema: Secondary | ICD-10-CM | POA: Diagnosis not present

## 2023-08-19 DIAGNOSIS — M25561 Pain in right knee: Secondary | ICD-10-CM | POA: Diagnosis not present

## 2023-08-19 DIAGNOSIS — M25661 Stiffness of right knee, not elsewhere classified: Secondary | ICD-10-CM | POA: Diagnosis not present

## 2023-08-24 DIAGNOSIS — M25661 Stiffness of right knee, not elsewhere classified: Secondary | ICD-10-CM | POA: Diagnosis not present

## 2023-08-24 DIAGNOSIS — M25561 Pain in right knee: Secondary | ICD-10-CM | POA: Diagnosis not present

## 2023-08-24 DIAGNOSIS — R600 Localized edema: Secondary | ICD-10-CM | POA: Diagnosis not present

## 2023-08-26 DIAGNOSIS — M25661 Stiffness of right knee, not elsewhere classified: Secondary | ICD-10-CM | POA: Diagnosis not present

## 2023-08-26 DIAGNOSIS — M25561 Pain in right knee: Secondary | ICD-10-CM | POA: Diagnosis not present

## 2023-08-26 DIAGNOSIS — R600 Localized edema: Secondary | ICD-10-CM | POA: Diagnosis not present

## 2023-08-30 DIAGNOSIS — M25561 Pain in right knee: Secondary | ICD-10-CM | POA: Diagnosis not present

## 2023-08-31 DIAGNOSIS — M25561 Pain in right knee: Secondary | ICD-10-CM | POA: Diagnosis not present

## 2023-08-31 DIAGNOSIS — M25661 Stiffness of right knee, not elsewhere classified: Secondary | ICD-10-CM | POA: Diagnosis not present

## 2023-08-31 DIAGNOSIS — R600 Localized edema: Secondary | ICD-10-CM | POA: Diagnosis not present

## 2023-09-02 DIAGNOSIS — M25661 Stiffness of right knee, not elsewhere classified: Secondary | ICD-10-CM | POA: Diagnosis not present

## 2023-09-02 DIAGNOSIS — M25561 Pain in right knee: Secondary | ICD-10-CM | POA: Diagnosis not present

## 2023-09-02 DIAGNOSIS — R600 Localized edema: Secondary | ICD-10-CM | POA: Diagnosis not present

## 2023-09-03 ENCOUNTER — Other Ambulatory Visit: Payer: Self-pay

## 2023-09-03 ENCOUNTER — Emergency Department (HOSPITAL_COMMUNITY)
Admission: EM | Admit: 2023-09-03 | Discharge: 2023-09-03 | Disposition: A | Discharge status: Home / Self Care | Payer: BC Managed Care – PPO | Attending: Emergency Medicine | Admitting: Emergency Medicine

## 2023-09-03 ENCOUNTER — Emergency Department (HOSPITAL_COMMUNITY): Payer: BC Managed Care – PPO

## 2023-09-03 DIAGNOSIS — S4991XA Unspecified injury of right shoulder and upper arm, initial encounter: Secondary | ICD-10-CM | POA: Diagnosis not present

## 2023-09-03 DIAGNOSIS — S51811A Laceration without foreign body of right forearm, initial encounter: Secondary | ICD-10-CM | POA: Diagnosis not present

## 2023-09-03 DIAGNOSIS — S51819A Laceration without foreign body of unspecified forearm, initial encounter: Secondary | ICD-10-CM | POA: Diagnosis not present

## 2023-09-03 DIAGNOSIS — W25XXXA Contact with sharp glass, initial encounter: Secondary | ICD-10-CM | POA: Diagnosis not present

## 2023-09-03 DIAGNOSIS — S56901A Unspecified injury of unspecified muscles, fascia and tendons at forearm level, right arm, initial encounter: Secondary | ICD-10-CM | POA: Diagnosis not present

## 2023-09-03 DIAGNOSIS — S41111A Laceration without foreign body of right upper arm, initial encounter: Secondary | ICD-10-CM

## 2023-09-03 MED ORDER — OXYCODONE-ACETAMINOPHEN 5-325 MG PO TABS: 1.0000 | ORAL_TABLET | Freq: Four times a day (QID) | ORAL | 0 refills | Status: DC | PRN

## 2023-09-03 MED ORDER — LORAZEPAM 1 MG PO TABS
1.0000 mg | ORAL_TABLET | Freq: Once | ORAL | Status: AC
  Administered 2023-09-03: 1 mg via ORAL
  Filled 2023-09-03: qty 1

## 2023-09-03 MED ORDER — LIDOCAINE-EPINEPHRINE (PF) 2 %-1:200000 IJ SOLN
10.0000 mL | Freq: Once | INTRAMUSCULAR | Status: AC
  Administered 2023-09-03: 10 mL
  Filled 2023-09-03: qty 20

## 2023-09-03 MED ORDER — OXYCODONE-ACETAMINOPHEN 5-325 MG PO TABS
1.0000 | ORAL_TABLET | Freq: Once | ORAL | Status: AC
  Administered 2023-09-03: 1 via ORAL
  Filled 2023-09-03: qty 1

## 2023-09-03 NOTE — Discharge Instructions (Signed)
You were seen in the emergency department for your right arm laceration.   We have closed your laceration(s) with sutures. These need to be removed in 10 days. This can be done at any doctor's office, urgent care, or emergency department.   If any of the sutures come out before it is time for removal, that is okay. Make sure to keep the area as clean and dry as possible. You can let warm soapy warm run over the area, but do NOT scrub it.   Watch out for signs of infection, like we discussed, including: increased redness, tenderness, or drainage of pus from the area. If this happens and you have not been prescribed an antibiotic, please seek medical attention for possible infection.   Additionally, your forearm muscle belly was partially torn and needed sutures to repair.  The good thing is that you have full function of your arm currently and I have no indication to expect that this will change.  However, if you do have any complications with functionality of your arm or hand then I have given you a referral to a hand specialist with a number to call to schedule an appointment for follow-up.  You can take over the counter pain medicine like ibuprofen or tylenol as needed.   Return if development of any new or worsening symptoms.

## 2023-09-03 NOTE — ED Triage Notes (Addendum)
Pt BIB [employer] from work. Pt was picking up a mirror and it fell and sliced R forearm. Per EMS 2 inches lac. Pt wrapped arm with shirt to control bleeding. EMS rewrapped arm with gauze and coban. Pt denies hitting head, no LOC, denies n/v. Pt reports full sensation, feeling, and full movement in fingers with EMS   EMS VS BP 122/80 P 73 O2 100%

## 2023-09-03 NOTE — ED Provider Notes (Signed)
[place] EMERGENCY DEPARTMENT AT [facility] Provider Note   CSN: [id number] Arrival date & time: 09/03/23  1618     History  Chief Complaint  Patient presents with   Arm Injury    Jared Suarez is a 27 y.o. male.  Patient with noncontributory past medical history presents today with complaints of right arm injury.  He states that same occurred immediately prior to arrival today when he was installing an mirror and accidentally dropped a mirror on his forearm.  He notes pain to same.  Bleeding is currently controlled.  He has full function of his arm and hand as well as good sensation.  His tetanus is up-to-date.  The history is provided by the patient. No language interpreter was used.  Arm Injury      Home Medications Prior to Admission medications   Medication Sig Start Date End Date Taking? Authorizing Provider  Crisaborole (EUCRISA) 2 % OINT Apply to affected areas rash on face/body 1-2 times a day until improved. 12/23/21   Willeen Niece, MD  cyclobenzaprine (FLEXERIL) 5 MG tablet Take by mouth. 05/26/23   [provider]      Allergies    Rocephin [ceftriaxone sodium in dextrose]    Review of Systems   Review of Systems  Skin:  Positive for wound.  All other systems reviewed and are negative.   Physical Exam Updated Vital Signs BP 122/86   Pulse 81   Temp 98.1 F (36.7 C) (Oral)   Resp 19   Ht 6\' 3"  (1.905 m)   Wt 83.9 kg   SpO2 100%   BMI 23.12 kg/m  Physical Exam Vitals and nursing note reviewed.  Constitutional:      General: He is not in acute distress.    Appearance: Normal appearance. He is normal weight. He is not ill-appearing, toxic-appearing or diaphoretic.  HENT:     Head: Normocephalic and atraumatic.  Cardiovascular:     Rate and Rhythm: Normal rate.  Pulmonary:     Effort: Pulmonary effort is normal. No respiratory distress.  Musculoskeletal:        General: Normal range of motion.     Cervical back: Normal range of  motion.     Comments: Forearm laceration with muscle belly involvement.  Full flexion and extension intact.  Good sensation and capillary refill.  Radial and ulnar pulses intact and 2+.  Compartments soft.  Skin:    General: Skin is warm and dry.     Comments: 8 cm horizontal laceration noted to the middle anterior forearm.  Bleeding controlled.  Neurological:     General: No focal deficit present.     Mental Status: He is alert.  Psychiatric:        Mood and Affect: Mood normal.        Behavior: Behavior normal.     ED Results / Procedures / Treatments   Labs (all labs ordered are listed, but only abnormal results are displayed) Labs Reviewed - No data to display  EKG None  Radiology No results found.  Procedures .Marland KitchenLaceration Repair  Date/Time: 09/03/2023 7:08 PM  Performed by: Silva Bandy, PA-C Authorized by: Silva Bandy, PA-C   Consent:    Consent obtained:  Verbal   Consent given by:  Patient   Risks, benefits, and alternatives were discussed: yes     Risks discussed:  Infection, pain, retained foreign body, tendon damage, vascular damage, poor wound healing, poor cosmetic result, need for additional repair  and nerve damage   Alternatives discussed:  No treatment, delayed treatment, observation and referral Universal protocol:    Procedure explained and questions answered to patient or proxy's satisfaction: yes     Imaging studies available: yes     Patient identity confirmed:  Verbally with patient Anesthesia:    Anesthesia method:  Local infiltration   Local anesthetic:  Lidocaine 2% WITH epi Laceration details:    Location:  Shoulder/arm   Shoulder/arm location:  R lower arm   Length (cm):  8   Depth (mm):  10 Exploration:    Imaging obtained: x-ray     Imaging outcome: foreign body not noted     Wound exploration: wound explored through full range of motion and entire depth of wound visualized     Wound extent: muscle damage   Treatment:    Area  cleansed with:  Saline   Amount of cleaning:  Extensive   Irrigation solution:  Sterile saline   Irrigation volume:  1 L   Irrigation method:  Pressure wash   Layers/structures repaired:  Muscle belly Muscle belly:    Suture size:  5-0   Suture material:  Vicryl   Suture technique:  Simple interrupted   Number of sutures:  4 Skin repair:    Repair method:  Sutures   Suture size:  3-0   Suture material:  Prolene   Suture technique:  Simple interrupted   Number of sutures:  10 Approximation:    Approximation:  Close Repair type:    Repair type:  Intermediate Post-procedure details:    Dressing:  Antibiotic ointment and non-adherent dressing   Procedure completion:  Tolerated well, no immediate complications     Medications Ordered in ED Medications  oxyCODONE-acetaminophen (PERCOCET/ROXICET) 5-325 MG per tablet 1 tablet (has no administration in time range)  LORazepam (ATIVAN) tablet 1 mg (has no administration in time range)  lidocaine-EPINEPHrine (XYLOCAINE W/EPI) 2 %-1:200000 (PF) injection 10 mL (has no administration in time range)    ED Course/ Medical Decision Making/ A&P                                 Medical Decision Making Amount and/or Complexity of Data Reviewed Radiology: ordered.  Risk Prescription drug management.   Patient presents today with complaints of right forearm laceration which occurred immediately prior to arrival today.  He is afebrile, nontoxic-appearing, and in no acute distress with reassuring vital signs.  Physical exam reveals 8 cm laceration noted to the mid right anterior forearm.  Does have muscle barrel of the involvement which has been repaired per above procedure which was well-tolerated. Pressure irrigation performed of wound. Wound explored and base of wound visualized in a bloodless field without evidence of foreign body.  Laceration occurred < 8 hours prior to repair which was well tolerated.  Tdap up-to-date.  Pt has  no  comorbidities to effect normal wound healing. Pt discharged without antibiotics.  Discussed suture home care with patient and answered questions.  Given extent of injury, will give prescription for Percocet for pain.  PDMP reviewed.  Patient advised not to drive or operate heavy machinery while taking this medication.  Also, given muscular involvement, will give referral to hand surgery for follow-up.  Patient does have full function and sensation of his arm and hand. Pt to follow-up for wound check and suture removal in 10 days; they are to return to the ED sooner  for signs of infection. Pt is hemodynamically stable with no complaints prior to dc. Evaluation and diagnostic testing in the emergency department does not suggest an emergent condition requiring admission or immediate intervention beyond what has been performed at this time.  Plan for discharge with close PCP follow-up.  Patient is understanding and amenable with plan, educated on red flag symptoms that would prompt immediate return.  Patient discharged in stable condition.  This is a shared visit with supervising physician Dr. Anitra Lauth who has independently evaluated patient & provided guidance in evaluation/management/disposition, in agreement with care   Final Clinical Impression(s) / ED Diagnoses Final diagnoses:  Laceration of right upper extremity, initial encounter    Rx / DC Orders ED Discharge Orders          Ordered    oxyCODONE-acetaminophen (PERCOCET/ROXICET) 5-325 MG tablet  Every 6 hours PRN        09/03/23 1918          An After Visit Summary was printed and given to the patient.     Vear Clock 09/03/23 1919    Gwyneth Sprout, MD 09/03/23 [phone]

## 2023-09-07 DIAGNOSIS — M25661 Stiffness of right knee, not elsewhere classified: Secondary | ICD-10-CM | POA: Diagnosis not present

## 2023-09-07 DIAGNOSIS — R600 Localized edema: Secondary | ICD-10-CM | POA: Diagnosis not present

## 2023-09-07 DIAGNOSIS — M25561 Pain in right knee: Secondary | ICD-10-CM | POA: Diagnosis not present

## 2023-09-09 DIAGNOSIS — M25561 Pain in right knee: Secondary | ICD-10-CM | POA: Diagnosis not present

## 2023-09-09 DIAGNOSIS — M25661 Stiffness of right knee, not elsewhere classified: Secondary | ICD-10-CM | POA: Diagnosis not present

## 2023-09-09 DIAGNOSIS — R600 Localized edema: Secondary | ICD-10-CM | POA: Diagnosis not present

## 2023-09-13 ENCOUNTER — Ambulatory Visit: Payer: BC Managed Care – PPO | Admitting: Family Medicine

## 2023-09-13 ENCOUNTER — Encounter: Payer: Self-pay | Admitting: Family Medicine

## 2023-09-13 VITALS — BP 120/76 | HR 72 | Temp 97.3°F | Ht 74.0 in | Wt 190.0 lb

## 2023-09-13 DIAGNOSIS — Z4802 Encounter for removal of sutures: Secondary | ICD-10-CM | POA: Diagnosis not present

## 2023-09-13 DIAGNOSIS — S41111D Laceration without foreign body of right upper arm, subsequent encounter: Secondary | ICD-10-CM

## 2023-09-13 NOTE — Progress Notes (Signed)
Patient ID: Jared Suarez, male    DOB: [birth year], 27 y.o.   MRN: [id number]  This visit was conducted in person.  BP 120/76   Pulse 72   Temp (!) 97.3 F (36.3 C) (Temporal)   Ht 6\' 2"  (1.88 m)   Wt 190 lb (86.2 kg)   SpO2 98%   BMI 24.39 kg/m    CC:  Chief Complaint  Patient presents with   Suture / Staple Removal    Subjective:   HPI: Jared Suarez is a 27 y.o. male presenting on 09/13/2023 for Suture / Staple Removal  Reviewed ED note from 09/03/2023.. laceration of right upper extremity.  Cut arm moving a mirror.   Area healing well, no discharge. No redness at site.  No fever.      Relevant past medical, surgical, family and social history reviewed and updated as indicated. Interim medical history since our last visit reviewed. Allergies and medications reviewed and updated. Outpatient Medications Prior to Visit  Medication Sig Dispense Refill   Crisaborole (EUCRISA) 2 % OINT Apply to affected areas rash on face/body 1-2 times a day until improved. (Patient not taking: Reported on 09/13/2023) 100 g 1   cyclobenzaprine (FLEXERIL) 5 MG tablet Take by mouth. (Patient not taking: Reported on 09/13/2023)     oxyCODONE-acetaminophen (PERCOCET/ROXICET) 5-325 MG tablet Take 1 tablet by mouth every 6 (six) hours as needed for severe pain. (Patient not taking: Reported on 09/13/2023) 15 tablet 0   No facility-administered medications prior to visit.     Per HPI unless specifically indicated in ROS section below Review of Systems  Constitutional:  Negative for fatigue and fever.  HENT:  Negative for ear pain.   Eyes:  Negative for pain.  Respiratory:  Negative for cough and shortness of breath.   Cardiovascular:  Negative for chest pain, palpitations and leg swelling.  Gastrointestinal:  Negative for abdominal pain.  Genitourinary:  Negative for dysuria.  Musculoskeletal:  Negative for arthralgias.  Neurological:  Negative for syncope, light-headedness and headaches.   Psychiatric/Behavioral:  Negative for dysphoric mood.    Objective:  BP 120/76   Pulse 72   Temp (!) 97.3 F (36.3 C) (Temporal)   Ht 6\' 2"  (1.88 m)   Wt 190 lb (86.2 kg)   SpO2 98%   BMI 24.39 kg/m   Wt Readings from Last 3 Encounters:  09/13/23 190 lb (86.2 kg)  09/03/23 185 lb (83.9 kg)  05/26/23 190 lb (86.2 kg)      Physical Exam Skin:    Comments: Well-healing laceration right lower inner arm       Results for orders placed or performed during the hospital encounter of 05/26/23  Basic metabolic panel  Result Value Ref Range   Sodium 135 135 - 145 mmol/L   Potassium 3.4 (L) 3.5 - 5.1 mmol/L   Chloride 100 98 - 111 mmol/L   CO2 25 22 - 32 mmol/L   Glucose, Bld 114 (H) 70 - 99 mg/dL   BUN 15 6 - 20 mg/dL   Creatinine, Ser 4.09 0.61 - 1.24 mg/dL   Calcium 8.9 8.9 - 81.1 mg/dL   GFR, Estimated >91 >47 mL/min   Anion gap 10 5 - 15  CBC with Differential  Result Value Ref Range   WBC 11.9 (H) 4.0 - 10.5 K/uL   RBC 4.95 4.22 - 5.81 MIL/uL   Hemoglobin 14.6 13.0 - 17.0 g/dL   HCT 82.9 56.2 - 13.0 %   MCV 82.2  80.0 - 100.0 fL   MCH 29.5 26.0 - 34.0 pg   MCHC 35.9 30.0 - 36.0 g/dL   RDW 58.5 27.7 - 82.4 %   Platelets 234 150 - 400 K/uL   nRBC 0.0 0.0 - 0.2 %   Neutrophils Relative % 73 %   Neutro Abs 8.6 (H) 1.7 - 7.7 K/uL   Lymphocytes Relative 16 %   Lymphs Abs 2.0 0.7 - 4.0 K/uL   Monocytes Relative 11 %   Monocytes Absolute 1.3 (H) 0.1 - 1.0 K/uL   Eosinophils Relative 0 %   Eosinophils Absolute 0.0 0.0 - 0.5 K/uL   Basophils Relative 0 %   Basophils Absolute 0.0 0.0 - 0.1 K/uL   Immature Granulocytes 0 %   Abs Immature Granulocytes 0.04 0.00 - 0.07 K/uL    Assessment and Plan   Arm laceration, right, subsequent encounter  Procedure:  10 sutures removed without  complication from right forearm laceration.  No bleeding, pt tolerated procedure well.Wound edges well-approximated.  Steri-Strips applied with benzoin tincture.  Recommended keeping  area clean and dry, washing daily only. Return and ER precautions provided.   No follow-ups on file.   Kerby Nora, MD

## 2023-09-24 DIAGNOSIS — R600 Localized edema: Secondary | ICD-10-CM | POA: Diagnosis not present

## 2023-09-24 DIAGNOSIS — M25561 Pain in right knee: Secondary | ICD-10-CM | POA: Diagnosis not present

## 2023-09-24 DIAGNOSIS — M25661 Stiffness of right knee, not elsewhere classified: Secondary | ICD-10-CM | POA: Diagnosis not present

## 2023-10-11 DIAGNOSIS — M25561 Pain in right knee: Secondary | ICD-10-CM | POA: Diagnosis not present

## 2023-10-11 DIAGNOSIS — R600 Localized edema: Secondary | ICD-10-CM | POA: Diagnosis not present

## 2023-10-11 DIAGNOSIS — M25661 Stiffness of right knee, not elsewhere classified: Secondary | ICD-10-CM | POA: Diagnosis not present

## 2023-10-19 ENCOUNTER — Encounter: Payer: Self-pay | Admitting: Family Medicine

## 2023-10-19 ENCOUNTER — Ambulatory Visit (INDEPENDENT_AMBULATORY_CARE_PROVIDER_SITE_OTHER): Payer: BC Managed Care – PPO | Admitting: Family Medicine

## 2023-10-19 VITALS — BP 100/68 | HR 71 | Temp 99.0°F | Ht 74.0 in | Wt 187.1 lb

## 2023-10-19 DIAGNOSIS — M79601 Pain in right arm: Secondary | ICD-10-CM | POA: Diagnosis not present

## 2023-10-19 NOTE — Assessment & Plan Note (Signed)
Acute, well-healed right forearm laceration, no clear sign of ongoing infection.  Strength is normal so muscles are intact.  Most likely scar tissue in addition to muscle injury causing soreness with wrist flexion.  Recommended massage of scar itself, application of Moderma and to start home physical therapy of right arm.  If not improving as expected in the next month will refer him to sports medicine for further evaluation.

## 2023-10-19 NOTE — Progress Notes (Signed)
Patient ID: Jared Suarez, male    DOB: 1996/02/26, 27 y.o.   MRN: [id number]  This visit was conducted in person.  BP 100/68 (BP Location: Left Arm, Patient Position: Sitting, Cuff Size: Large)   Pulse 71   Temp 99 F (37.2 C) (Temporal)   Ht 6\' 2"  (1.88 m)   Wt 187 lb 2 oz (84.9 kg)   SpO2 99%   BMI 24.03 kg/m    CC:  Chief Complaint  Patient presents with   Arm Pain    Right-From Arm laceration back in September    Subjective:   HPI: Jared Suarez is a 27 y.o. male presenting on 10/19/2023 for Arm Pain (Right-From Arm laceration back in September)    Reviewed my last OV note from 09/13/23.. Reviewed ED note from 09/03/2023.. laceration of right upper extremity. Cut arm moving a mirror... occurred at work.   He notes pain below  right arm laceration.. none at rest,, notes mainly with stretching or lifting with forearm.  Pain win forearm when gripping things.   No swelling, no  numbness swelling.  No weakness. No redness spreading or discharge.     Relevant past medical, surgical, family and social history reviewed and updated as indicated. Interim medical history since our last visit reviewed. Allergies and medications reviewed and updated. Outpatient Medications Prior to Visit  Medication Sig Dispense Refill   Crisaborole (EUCRISA) 2 % OINT Apply to affected areas rash on face/body 1-2 times a day until improved. (Patient not taking: Reported on 09/13/2023) 100 g 1   cyclobenzaprine (FLEXERIL) 5 MG tablet Take by mouth. (Patient not taking: Reported on 09/13/2023)     oxyCODONE-acetaminophen (PERCOCET/ROXICET) 5-325 MG tablet Take 1 tablet by mouth every 6 (six) hours as needed for severe pain. (Patient not taking: Reported on 09/13/2023) 15 tablet 0   No facility-administered medications prior to visit.     Per HPI unless specifically indicated in ROS section below Review of Systems  Constitutional:  Negative for fatigue and fever.  HENT:  Negative for ear pain.    Eyes:  Negative for pain.  Respiratory:  Negative for cough and shortness of breath.   Cardiovascular:  Negative for chest pain, palpitations and leg swelling.  Gastrointestinal:  Negative for abdominal pain.  Genitourinary:  Negative for dysuria.  Musculoskeletal:  Negative for arthralgias.  Neurological:  Negative for syncope, light-headedness and headaches.  Psychiatric/Behavioral:  Negative for dysphoric mood.    Objective:  BP 100/68 (BP Location: Left Arm, Patient Position: Sitting, Cuff Size: Large)   Pulse 71   Temp 99 F (37.2 C) (Temporal)   Ht 6\' 2"  (1.88 m)   Wt 187 lb 2 oz (84.9 kg)   SpO2 99%   BMI 24.03 kg/m   Wt Readings from Last 3 Encounters:  10/19/23 187 lb 2 oz (84.9 kg)  09/13/23 190 lb (86.2 kg)  09/03/23 185 lb (83.9 kg)      Physical Exam Constitutional:      Appearance: He is well-developed.  HENT:     Head: Normocephalic.     Right Ear: Hearing normal.     Left Ear: Hearing normal.     Nose: Nose normal.  Neck:     Thyroid: No thyroid mass or thyromegaly.     Vascular: No carotid bruit.     Trachea: Trachea normal.  Cardiovascular:     Rate and Rhythm: Normal rate and regular rhythm.     Pulses: Normal pulses.  Heart sounds: Heart sounds not distant. No murmur heard.    No friction rub. No gallop.     Comments: No peripheral edema Pulmonary:     Effort: Pulmonary effort is normal. No respiratory distress.     Breath sounds: Normal breath sounds.  Musculoskeletal:     Right forearm: Tenderness present. No swelling.     Left forearm: No swelling, tenderness or bony tenderness.     Comments: 5 out of 5 wrist flexion, extension, supination and pronation Patient's forearm tender over the brachial radialis muscle belly  Skin:    General: Skin is warm and dry.     Findings: No rash.     Comments: Well-healed right forearm laceration,  Psychiatric:        Speech: Speech normal.        Behavior: Behavior normal.        Thought Content:  Thought content normal.       Results for orders placed or performed during the hospital encounter of 05/26/23  Basic metabolic panel  Result Value Ref Range   Sodium 135 135 - 145 mmol/L   Potassium 3.4 (L) 3.5 - 5.1 mmol/L   Chloride 100 98 - 111 mmol/L   CO2 25 22 - 32 mmol/L   Glucose, Bld 114 (H) 70 - 99 mg/dL   BUN 15 6 - 20 mg/dL   Creatinine, Ser 1.61 0.61 - 1.24 mg/dL   Calcium 8.9 8.9 - 09.6 mg/dL   GFR, Estimated >04 >54 mL/min   Anion gap 10 5 - 15  CBC with Differential  Result Value Ref Range   WBC 11.9 (H) 4.0 - 10.5 K/uL   RBC 4.95 4.22 - 5.81 MIL/uL   Hemoglobin 14.6 13.0 - 17.0 g/dL   HCT 09.8 11.9 - 14.7 %   MCV 82.2 80.0 - 100.0 fL   MCH 29.5 26.0 - 34.0 pg   MCHC 35.9 30.0 - 36.0 g/dL   RDW 82.9 56.2 - 13.0 %   Platelets 234 150 - 400 K/uL   nRBC 0.0 0.0 - 0.2 %   Neutrophils Relative % 73 %   Neutro Abs 8.6 (H) 1.7 - 7.7 K/uL   Lymphocytes Relative 16 %   Lymphs Abs 2.0 0.7 - 4.0 K/uL   Monocytes Relative 11 %   Monocytes Absolute 1.3 (H) 0.1 - 1.0 K/uL   Eosinophils Relative 0 %   Eosinophils Absolute 0.0 0.0 - 0.5 K/uL   Basophils Relative 0 %   Basophils Absolute 0.0 0.0 - 0.1 K/uL   Immature Granulocytes 0 %   Abs Immature Granulocytes 0.04 0.00 - 0.07 K/uL    Assessment and Plan  Right arm pain Assessment & Plan: Acute, well-healed right forearm laceration, no clear sign of ongoing infection.  Strength is normal so muscles are intact.  Most likely scar tissue in addition to muscle injury causing soreness with wrist flexion.  Recommended massage of scar itself, application of Moderma and to start home physical therapy of right arm.  If not improving as expected in the next month will refer him to sports medicine for further evaluation.     No follow-ups on file.   Kerby Nora, MD

## 2023-11-09 DIAGNOSIS — M25661 Stiffness of right knee, not elsewhere classified: Secondary | ICD-10-CM | POA: Diagnosis not present

## 2023-11-09 DIAGNOSIS — R600 Localized edema: Secondary | ICD-10-CM | POA: Diagnosis not present

## 2023-11-09 DIAGNOSIS — M25561 Pain in right knee: Secondary | ICD-10-CM | POA: Diagnosis not present

## 2023-11-15 DIAGNOSIS — M25661 Stiffness of right knee, not elsewhere classified: Secondary | ICD-10-CM | POA: Diagnosis not present

## 2023-11-23 DIAGNOSIS — M25561 Pain in right knee: Secondary | ICD-10-CM | POA: Diagnosis not present

## 2023-11-23 DIAGNOSIS — M25661 Stiffness of right knee, not elsewhere classified: Secondary | ICD-10-CM | POA: Diagnosis not present

## 2023-11-23 DIAGNOSIS — R600 Localized edema: Secondary | ICD-10-CM | POA: Diagnosis not present

## 2023-12-29 ENCOUNTER — Encounter: Payer: BC Managed Care – PPO | Admitting: Family Medicine

## 2023-12-30 ENCOUNTER — Encounter: Payer: Self-pay | Admitting: Family Medicine

## 2024-01-07 DIAGNOSIS — M25561 Pain in right knee: Secondary | ICD-10-CM | POA: Diagnosis not present

## 2024-01-07 DIAGNOSIS — M25661 Stiffness of right knee, not elsewhere classified: Secondary | ICD-10-CM | POA: Diagnosis not present

## 2024-01-14 DIAGNOSIS — M25561 Pain in right knee: Secondary | ICD-10-CM | POA: Diagnosis not present

## 2024-01-14 DIAGNOSIS — M25661 Stiffness of right knee, not elsewhere classified: Secondary | ICD-10-CM | POA: Diagnosis not present

## 2024-01-21 DIAGNOSIS — M25661 Stiffness of right knee, not elsewhere classified: Secondary | ICD-10-CM | POA: Diagnosis not present

## 2024-01-21 DIAGNOSIS — M25561 Pain in right knee: Secondary | ICD-10-CM | POA: Diagnosis not present

## 2024-07-21 ENCOUNTER — Encounter: Payer: Self-pay | Admitting: Family

## 2024-07-21 ENCOUNTER — Ambulatory Visit (INDEPENDENT_AMBULATORY_CARE_PROVIDER_SITE_OTHER): Admitting: Family

## 2024-07-21 VITALS — BP 118/70 | HR 64 | Temp 99.3°F | Ht 74.0 in | Wt 188.0 lb

## 2024-07-21 DIAGNOSIS — J029 Acute pharyngitis, unspecified: Secondary | ICD-10-CM

## 2024-07-21 DIAGNOSIS — J3089 Other allergic rhinitis: Secondary | ICD-10-CM | POA: Insufficient documentation

## 2024-07-21 DIAGNOSIS — K120 Recurrent oral aphthae: Secondary | ICD-10-CM | POA: Insufficient documentation

## 2024-07-21 DIAGNOSIS — H1031 Unspecified acute conjunctivitis, right eye: Secondary | ICD-10-CM | POA: Insufficient documentation

## 2024-07-21 LAB — POCT RAPID STREP A (OFFICE): Rapid Strep A Screen: NEGATIVE

## 2024-07-21 MED ORDER — OFLOXACIN 0.3 % OP SOLN
1.0000 [drp] | Freq: Four times a day (QID) | OPHTHALMIC | 0 refills | Status: AC
Start: 2024-07-21 — End: 2024-07-28

## 2024-07-21 NOTE — Progress Notes (Signed)
 Established Patient Office Visit  Subjective:   Patient ID: Jared Suarez, male    DOB: [birth year]  Age: 28 y.o. MRN: [id number]  CC:  Chief Complaint  Patient presents with   Illness    Started with a sore throat 07-12-24. Nasal congestion started around 7-27. Right eye redness started yesterday. No fever or malaise/fatigue.     HPI: Jared Suarez is a 28 y.o. male presenting on 07/21/2024 for Illness (Started with a sore throat 07-12-24. Nasal congestion started around 7-27. Right eye redness started yesterday. No fever or malaise/fatigue. )  Here with c/o sore throat.  He did notice some nasal congestion  No cough or chest congestion.  No fever.  No ear pain.  Started with right redness when he woke up, today with some improvement.  No crusting.   Was improving however he states his throat is still pretty painful.  Some pnd  Sneezing.  Cough in am.        ROS: Negative unless specifically indicated above in HPI.   Relevant past medical history reviewed and updated as indicated.   Allergies and medications reviewed and updated.   Current Outpatient Medications:    ofloxacin (OCUFLOX) 0.3 % ophthalmic solution, Place 1 drop into the right eye 4 (four) times daily for 7 days., Disp: 1.4 mL, Rfl: 0  Allergies  Allergen Reactions   Rocephin [Ceftriaxone Sodium In Dextrose ] Other (See Comments)    Unknown    Objective:   BP 118/70 (BP Location: Left Arm, Patient Position: Sitting, Cuff Size: Large)   Pulse 64   Temp 99.3 F (37.4 C) (Temporal)   Ht 6' 2 (1.88 m)   Wt 188 lb (85.3 kg)   SpO2 98%   BMI 24.14 kg/m    Physical Exam Vitals reviewed.  Constitutional:      General: He is not in acute distress.    Appearance: Normal appearance. He is obese. He is not ill-appearing, toxic-appearing or diaphoretic.  HENT:     Head: Normocephalic.     Right Ear: A middle ear effusion is present.     Left Ear: Tympanic membrane normal.     Nose: Nose normal.      Mouth/Throat:     Mouth: Mucous membranes are moist. Oral lesions (left posterior tongue with apthous ulcer with some mild surrounding erythema) present.     Pharynx: Posterior oropharyngeal erythema and postnasal drip present.     Tonsils: No tonsillar exudate.  Eyes:     General: Lids are normal. Vision grossly intact. Gaze aligned appropriately. No allergic shiner.       Right eye: No foreign body, discharge or hordeolum.     Conjunctiva/sclera:     Right eye: Right conjunctiva is injected.     Pupils: Pupils are equal, round, and reactive to light.  Cardiovascular:     Rate and Rhythm: Normal rate and regular rhythm.  Pulmonary:     Effort: Pulmonary effort is normal.     Breath sounds: Normal breath sounds. No wheezing.  Musculoskeletal:        General: Normal range of motion.     Cervical back: Normal range of motion.  Lymphadenopathy:     Cervical: Cervical adenopathy present.     Right cervical: Superficial cervical adenopathy (mild) present.     Left cervical: Superficial cervical adenopathy (mild) present.  Neurological:     General: No focal deficit present.     Mental Status: He is alert and oriented to person,  place, and time. Mental status is at baseline.  Psychiatric:        Mood and Affect: Mood normal.        Behavior: Behavior normal.        Thought Content: Thought content normal.        Judgment: Judgment normal.     Assessment & Plan:  Sore throat -     POCT rapid strep A  Acute bacterial conjunctivitis of right eye Assessment & Plan: rx for ofloxacin sent to pt pharmacy, take as prescribed.  Discussed how to wash eye appropriately with warm warm cloth from inner to outer canthus. Wash bed sheets and pillow cases to prevent re-infection. Frequent handwashing. If no improvement in 24-48 hours with eye drops as prescribed, please call office.     Orders: -     Ofloxacin; Place 1 drop into the right eye 4 (four) times daily for 7 days.  Dispense: 1.4 mL;  Refill: 0  Aphthous ulcer Assessment & Plan: Visualized on physical exam.  Discussed oragel mouthwash.  If persistent can consider throat/viral culture to r/o other etiology  Strep negative in office.    Non-seasonal allergic rhinitis, unspecified trigger Assessment & Plan: Assume post viral process however visual signs of allergic rhinitis on exam.  Advised to start nightly zyrtec       Follow up plan: Return for f/u PCP if no improvement in symptoms.  Ginger Patrick, FNP

## 2024-07-21 NOTE — Assessment & Plan Note (Signed)
 Visualized on physical exam.  Discussed oragel mouthwash.  If persistent can consider throat/viral culture to r/o other etiology  Strep negative in office.

## 2024-07-21 NOTE — Patient Instructions (Signed)
  Try some over the counter orajel mouthwash for cold wash  Start some nightly zyrtec

## 2024-07-21 NOTE — Assessment & Plan Note (Signed)
rx for ofloxacin sent to pt pharmacy, take as prescribed.  Discussed how to wash eye appropriately with warm warm cloth from inner to outer canthus. Wash bed sheets and pillow cases to prevent re-infection. Frequent handwashing. If no improvement in 24-48 hours with eye drops as prescribed, please call office.   

## 2024-07-21 NOTE — Assessment & Plan Note (Signed)
 Assume post viral process however visual signs of allergic rhinitis on exam.  Advised to start nightly zyrtec

## 2024-07-26 ENCOUNTER — Ambulatory Visit: Payer: Self-pay

## 2024-07-26 NOTE — Telephone Encounter (Signed)
 FYI Only or Action Required?: FYI only for provider.  Patient was last seen in primary care on 07/21/2024 by Corwin Antu, FNP.  Called Nurse Triage reporting Triage.  Symptoms began today.  Interventions attempted: OTC medications: ibuprofen , flonase, zyrtec.  Symptoms are: gradually worsening.  Triage Disposition: See Physician Within 24 Hours  Patient/caregiver understands and will follow disposition?: Yes Reason for Disposition  [1] Sinus pain (not just congestion) AND [2] fever  Answer Assessment - Initial Assessment Questions Reports no improvement with zyrtec in the AM and benadryl in the PM. Is taking ibuprofen  PRN. Advised to continue and that he may add on Flonase and increase water intake.   1. LOCATION: Where does it hurt?      Sinus pain/pressure  2. ONSET: When did the sinus pain start?  (e.g., hours, days)      Today, was seen in office 07/21/24 for sore throat (neg strep) and advised to return if other symptoms develop  3. SEVERITY: How bad is the pain?   (Scale 0-10; or none, mild, moderate or severe)     Moderate  5. NASAL CONGESTION: Is the nose blocked? If Yes, ask: Can you open it or must you breathe through your mouth?     Congestion present  7. FEVER: Do you have a fever? If Yes, ask: What is it, how was it measured, and when did it start?      Does not have thermometer, but feels like he has had a fever based on chills/aches  Protocols used: Sinus Pain or Congestion-A-AH   Copied from CRM #[id number]. Topic: Clinical - Red Word Triage >> Jul 26, 2024  2:42 PM Larissa S wrote: [facility] that prompted transfer to Nurse Triage: headache, body aches, fever- worsening x 5 days

## 2024-07-26 NOTE — Telephone Encounter (Signed)
 Noted. Pt has OV tomorrow at 11:20 with Dr Avelina.

## 2024-07-27 ENCOUNTER — Ambulatory Visit: Admitting: Family Medicine

## 2024-07-27 ENCOUNTER — Ambulatory Visit (INDEPENDENT_AMBULATORY_CARE_PROVIDER_SITE_OTHER): Admitting: Internal Medicine

## 2024-07-27 ENCOUNTER — Encounter: Payer: Self-pay | Admitting: Internal Medicine

## 2024-07-27 VITALS — BP 104/78 | HR 57 | Temp 99.1°F | Ht 74.0 in | Wt 189.0 lb

## 2024-07-27 DIAGNOSIS — R509 Fever, unspecified: Secondary | ICD-10-CM | POA: Diagnosis not present

## 2024-07-27 DIAGNOSIS — U071 COVID-19: Secondary | ICD-10-CM | POA: Insufficient documentation

## 2024-07-27 LAB — POC COVID19 BINAXNOW: SARS Coronavirus 2 Ag: POSITIVE — AB

## 2024-07-27 NOTE — Progress Notes (Signed)
 Subjective:    Patient ID: Jared Suarez, male    DOB: 1996-12-19, 27 y.o.   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 1.4 mL 0   No current facility-administered medications on file prior to visit.    Allergies  Allergen Reactions   Rocephin [Ceftriaxone Sodium In Dextrose ] Other (See Comments)    Unknown    Past Medical History:  Diagnosis Date   History of chicken pox     Past Surgical History:  Procedure Laterality Date   Achilles Tendon Tear  Right    Broken Finger Right    Broke and Dislocated   ORIF WRIST FRACTURE Right 01/25/2019   Procedure: OPEN REDUCTION INTERNAL FIXATION (ORIF) Right small metacarpal fracture;  Surgeon: Sissy Cough, MD;  Location: [facility] OR;  Service: Orthopedics;  Laterality: Right;   TONSILLECTOMY  2004    Family History  Problem Relation Age of Onset   Cancer Mother        NHL   Sudden death Paternal Uncle 8       massive MI   Diabetes Maternal Grandfather    Cancer Other        breast, lung, MM (great grandparents)   Stroke Paternal Grandfather    Hypertension Father    CAD Paternal Uncle 65       MI?   Dementia Paternal Grandmother        early onset, ?Alz (50s)   COPD Paternal Grandmother     Social History   Socioeconomic History   Marital status: Single    Spouse name: Not on file   Number of children: Not on file   Years of education: Not on file    Highest education level: Not on file  Occupational History   Not on file  Tobacco Use   Smoking status: Never    Passive exposure: Yes   Smokeless tobacco: Never  Vaping Use   Vaping status: Never Used  Substance and Sexual Activity   Alcohol use: No   Drug use: No   Sexual activity: Not on file  Other Topics Concern   Not on file  Social History Narrative   Lives with parents, 4 siblings (12yo to 23yo), and nephew, 2 dogs.    Grew up in [place].    [employer] 11th, A/B/C. Cs in math and chemistry.    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 Wants to go on to college.   Social Drivers of Corporate investment banker Strain: Not on file  Food Insecurity: Not on file  Transportation Needs: Not on file  Physical Activity: Not on file  Stress: Not on file  Social Connections: Not on file  Intimate Partner Violence: Not on file   Review of Systems Some nausea---no vomiting Appetite is off No change in smell or taste    Objective:   Physical Exam Constitutional:      Appearance: Normal appearance.  HENT:  Head:     Comments: Slight frontal tenderness    Right Ear: Tympanic membrane and ear canal normal.     Left Ear: Tympanic membrane and ear canal normal.     Mouth/Throat:     Pharynx: No oropharyngeal exudate or posterior oropharyngeal erythema.  Pulmonary:     Effort: Pulmonary effort is normal.     Breath sounds: Normal breath sounds. No wheezing or rales.  Musculoskeletal:     Cervical back: Neck supple.  Lymphadenopathy:     Cervical: No cervical adenopathy.  Neurological:     Mental Status: He is alert.            Assessment & Plan:

## 2024-07-27 NOTE — Assessment & Plan Note (Signed)
 Significant systemic symptoms yesterday but already improving Discussed supportive care--mostly analgesics ER if worsens/SOB Discussed paxlovid--I don't think it is indicated RTW 8/11
